# Patient Record
Sex: Male | Born: 1960 | Race: White | Hispanic: No | Marital: Married | Smoking: Never smoker
Health system: Southern US, Community
[De-identification: ages and names within clinical notes are randomized; demographics above are authoritative.]

## PROBLEM LIST (undated history)

## (undated) DIAGNOSIS — I10 Essential (primary) hypertension: Secondary | ICD-10-CM

---

## 2003-01-03 ENCOUNTER — Emergency Department (HOSPITAL_COMMUNITY): Admission: EM | Admit: 2003-01-03 | Discharge: 2003-01-03 | Payer: Self-pay | Admitting: Emergency Medicine

## 2003-01-17 ENCOUNTER — Emergency Department (HOSPITAL_COMMUNITY): Admission: AD | Admit: 2003-01-17 | Discharge: 2003-01-17 | Payer: Self-pay | Admitting: Family Medicine

## 2020-02-06 ENCOUNTER — Other Ambulatory Visit: Payer: Self-pay

## 2020-02-06 ENCOUNTER — Encounter (HOSPITAL_BASED_OUTPATIENT_CLINIC_OR_DEPARTMENT_OTHER): Payer: Self-pay | Admitting: *Deleted

## 2020-02-06 ENCOUNTER — Emergency Department (HOSPITAL_BASED_OUTPATIENT_CLINIC_OR_DEPARTMENT_OTHER)
Admission: EM | Admit: 2020-02-06 | Discharge: 2020-02-06 | Disposition: A | Discharge status: Home / Self Care | Payer: PRIVATE HEALTH INSURANCE | Attending: Emergency Medicine | Admitting: Emergency Medicine

## 2020-02-06 DIAGNOSIS — F1010 Alcohol abuse, uncomplicated: Secondary | ICD-10-CM | POA: Diagnosis not present

## 2020-02-06 DIAGNOSIS — F419 Anxiety disorder, unspecified: Secondary | ICD-10-CM | POA: Insufficient documentation

## 2020-02-06 DIAGNOSIS — I10 Essential (primary) hypertension: Secondary | ICD-10-CM | POA: Insufficient documentation

## 2020-02-06 LAB — CBC WITH DIFFERENTIAL/PLATELET
Abs Immature Granulocytes: 0.02 10*3/uL (ref 0.00–0.07)
Basophils Absolute: 0.1 10*3/uL (ref 0.0–0.1)
Basophils Relative: 1 %
Eosinophils Absolute: 0 10*3/uL (ref 0.0–0.5)
Eosinophils Relative: 1 %
HCT: 44.6 % (ref 39.0–52.0)
Hemoglobin: 15.6 g/dL (ref 13.0–17.0)
Immature Granulocytes: 0 %
Lymphocytes Relative: 18 %
Lymphs Abs: 1.6 10*3/uL (ref 0.7–4.0)
MCH: 35.2 pg — ABNORMAL HIGH (ref 26.0–34.0)
MCHC: 35 g/dL (ref 30.0–36.0)
MCV: 100.7 fL — ABNORMAL HIGH (ref 80.0–100.0)
Monocytes Absolute: 1.3 10*3/uL — ABNORMAL HIGH (ref 0.1–1.0)
Monocytes Relative: 15 %
Neutro Abs: 5.7 10*3/uL (ref 1.7–7.7)
Neutrophils Relative %: 65 %
Platelets: 213 10*3/uL (ref 150–400)
RBC: 4.43 MIL/uL (ref 4.22–5.81)
RDW: 12.1 % (ref 11.5–15.5)
WBC: 8.7 10*3/uL (ref 4.0–10.5)
nRBC: 0 % (ref 0.0–0.2)

## 2020-02-06 LAB — COMPREHENSIVE METABOLIC PANEL
ALT: 55 U/L — ABNORMAL HIGH (ref 0–44)
AST: 73 U/L — ABNORMAL HIGH (ref 15–41)
Albumin: 4.8 g/dL (ref 3.5–5.0)
Alkaline Phosphatase: 54 U/L (ref 38–126)
Anion gap: 18 — ABNORMAL HIGH (ref 5–15)
BUN: 10 mg/dL (ref 6–20)
CO2: 24 mmol/L (ref 22–32)
Calcium: 9.8 mg/dL (ref 8.9–10.3)
Chloride: 95 mmol/L — ABNORMAL LOW (ref 98–111)
Creatinine, Ser: 0.84 mg/dL (ref 0.61–1.24)
GFR, Estimated: >60 mL/min (ref >60)
Glucose, Bld: 99 mg/dL (ref 70–99)
Potassium: 3.6 mmol/L (ref 3.5–5.1)
Sodium: 137 mmol/L (ref 135–145)
Total Bilirubin: 1.1 mg/dL (ref 0.3–1.2)
Total Protein: 8.8 g/dL — ABNORMAL HIGH (ref 6.5–8.1)

## 2020-02-06 LAB — OCCULT BLOOD X 1 CARD TO LAB, STOOL: Fecal Occult Bld: NEGATIVE

## 2020-02-06 MED ORDER — SODIUM CHLORIDE 0.9 % IV BOLUS
1000.0000 mL | Freq: Once | INTRAVENOUS | Status: AC
  Administered 2020-02-06: 1000 mL via INTRAVENOUS

## 2020-02-06 MED ORDER — LORAZEPAM 2 MG/ML IJ SOLN: 2.0000 mg | Freq: Once | INTRAMUSCULAR | Status: DC

## 2020-02-06 MED ORDER — LORAZEPAM 2 MG/ML IJ SOLN
2.0000 mg | Freq: Once | INTRAMUSCULAR | Status: AC
  Administered 2020-02-06: 2 mg via INTRAVENOUS
  Filled 2020-02-06: qty 1

## 2020-02-06 NOTE — ED Triage Notes (Signed)
He has had a very stressful day. He got tremors this afternoon. He does not take anxiety medications. He is a heavy alcohol drinker. He noticed bright red blood in his stool this am.

## 2020-02-06 NOTE — ED Provider Notes (Signed)
[facility] EMERGENCY DEPARTMENT Provider Note   CSN: [id number] Arrival date & time: 02/06/20  1638     History Chief Complaint  Patient presents with  . Hypertension    Matthew Cole is a 59 y.o. male.  The history is provided by the patient.  Illness Location:  Genereal Quality:  Tremors, anxiety Severity:  Moderate Onset quality:  Gradual Timing:  Constant Progression:  Unchanged Chronicity:  New Context:  Stressed, tremors, heavy alcohol user with last use yesterday., Having tremors, but no SI/HI. Not ready for rehab. Relieved by:  Nothing Worsened by:  Nothing Associated symptoms: no abdominal pain, no chest pain, no congestion, no cough, no ear pain, no fever, no rash, no shortness of breath, no sore throat and no vomiting        History reviewed. No pertinent past medical history.  There are no problems to display for this patient.   History reviewed. No pertinent surgical history.     No family history on file.  Social History   Tobacco Use  . Smoking status: Never Smoker  . Smokeless tobacco: Never Used  Substance Use Topics  . Alcohol use: Yes    Comment: 1 bottle of wine and a strong liquior drink every night    . Drug use: Never    Home Medications Prior to Admission medications   Not on File    Allergies    Patient has no known allergies.  Review of Systems   Review of Systems  Constitutional: Negative for chills and fever.  HENT: Negative for congestion, ear pain and sore throat.   Eyes: Negative for pain and visual disturbance.  Respiratory: Negative for cough and shortness of breath.   Cardiovascular: Negative for chest pain and palpitations.  Gastrointestinal: Positive for blood in stool. Negative for abdominal pain and vomiting.  Genitourinary: Negative for dysuria and hematuria.  Musculoskeletal: Negative for arthralgias and back pain.  Skin: Negative for color change and rash.  Neurological: Positive for  tremors. Negative for seizures and syncope.  All other systems reviewed and are negative.   Physical Exam Updated Vital Signs BP (!) 162/97 (BP Location: Right Arm)   Pulse 82   Temp 98 F (36.7 C) (Oral)   Resp (!) 23   Ht 6\' 5"  (1.956 m)   Wt 104.3 kg   SpO2 94%   BMI 27.27 kg/m   Physical Exam Vitals and nursing note reviewed.  Constitutional:      Appearance: He is well-developed.  HENT:     Head: Normocephalic and atraumatic.     Mouth/Throat:     Mouth: Mucous membranes are moist.  Eyes:     Extraocular Movements: Extraocular movements intact.     Conjunctiva/sclera: Conjunctivae normal.     Pupils: Pupils are equal, round, and reactive to light.  Cardiovascular:     Rate and Rhythm: Normal rate and regular rhythm.     Pulses: Normal pulses.     Heart sounds: No murmur heard.   Pulmonary:     Effort: Pulmonary effort is normal. No respiratory distress.     Breath sounds: Normal breath sounds.  Abdominal:     Palpations: Abdomen is soft.     Tenderness: There is no abdominal tenderness.  Musculoskeletal:     Cervical back: Neck supple.  Skin:    General: Skin is warm and dry.     Capillary Refill: Capillary refill takes less than 2 seconds.  Neurological:     General:  No focal deficit present.     Mental Status: He is alert and oriented to person, place, and time.     Cranial Nerves: No cranial nerve deficit.     Sensory: No sensory deficit.     Motor: No weakness.     Coordination: Coordination normal.     Comments: Resting tremor   Psychiatric:        Mood and Affect: Mood is anxious.        Thought Content: Thought content does not include homicidal or suicidal ideation.     ED Results / Procedures / Treatments   Labs (all labs ordered are listed, but only abnormal results are displayed) Labs Reviewed  CBC WITH DIFFERENTIAL/PLATELET - Abnormal; Notable for the following components:      Result Value   MCV 100.7 (*)    MCH 35.2 (*)     Monocytes Absolute 1.3 (*)    All other components within normal limits  COMPREHENSIVE METABOLIC PANEL - Abnormal; Notable for the following components:   Chloride 95 (*)    Total Protein 8.8 (*)    AST 73 (*)    ALT 55 (*)    Anion gap 18 (*)    All other components within normal limits  OCCULT BLOOD X 1 CARD TO LAB, STOOL    EKG EKG Interpretation  Date/Time:  Tuesday February 06 2020 17:00:30 EST Ventricular Rate:  98 PR Interval:  152 QRS Duration: 106 QT Interval:  376 QTC Calculation: 480 R Axis:   18 Text Interpretation: Sinus rhythm with frequent Premature ventricular complexes Minimal voltage criteria for LVH, may be normal variant ( Cornell product ) Prolonged QT Abnormal ECG Confirmed by Virgina Norfolk [phone]) on 02/06/2020 5:33:48 PM   Radiology No results found.  Procedures Procedures (including critical care time)  Medications Ordered in ED Medications  LORazepam (ATIVAN) injection 2 mg (2 mg Intravenous Given 02/06/20 1801)  sodium chloride 0.9 % bolus 1,000 mL (0 mLs Intravenous Stopped 02/06/20 1908)    ED Course  I have reviewed the triage vital signs and the nursing notes.  Pertinent labs & imaging results that were available during my care of the patient were reviewed by me and considered in my medical decision making (see chart for details).    MDM Rules/Calculators/A&P                          Matthew Cole is a 59 year old male with history of alcohol abuse who presents the ED with tremors, anxiety.  Normal vitals.  No fever.  Has had bad tremors for the last several hours with extreme anxiety.  Has not had alcohol today.  Last alcoholic beverage was last night.  Not ready for rehab.  Denies any suicidal homicidal ideation.  Possibly some mild alcohol withdrawal symptoms versus anxiety.  Patient improved following IV fluids and Ativan.  On reevaluation no longer having tremors.  Further discussion about rehab and patient is not ready for it and  will not prescribe Librium at this time.  He has resources to rehab already and was given him additional resources.  Lab work overall is unremarkable.  Mild liver enzyme elevation likely in the setting of chronic alcohol abuse.  Not having any abdominal pain.  Patient has had some blood in his stool over the last several weeks but Hemoccult was negative.  Grossly brown stool on exam.  Hemoglobin normal.  Likely hemorrhoid.  Given return precautions and  discharged from ED in good condition.  This chart was dictated using voice recognition software.  Despite best efforts to proofread,  errors can occur which can change the documentation meaning.   Final Clinical Impression(s) / ED Diagnoses Final diagnoses:  Anxiety  ETOH abuse    Rx / DC Orders ED Discharge Orders    None       Virgina Norfolk, DO 02/06/20 1910

## 2020-02-06 NOTE — ED Notes (Signed)
Discharge instructions discussed with patient. Verbalized understanding. Departs ED in stable condition at this time.   

## 2020-07-26 ENCOUNTER — Emergency Department (HOSPITAL_COMMUNITY): Payer: PRIVATE HEALTH INSURANCE

## 2020-07-26 ENCOUNTER — Emergency Department (HOSPITAL_COMMUNITY)
Admission: EM | Admit: 2020-07-26 | Discharge: 2020-07-26 | Disposition: A | Discharge status: Home / Self Care | Payer: PRIVATE HEALTH INSURANCE | Attending: Emergency Medicine | Admitting: Emergency Medicine

## 2020-07-26 DIAGNOSIS — F1090 Alcohol use, unspecified, uncomplicated: Secondary | ICD-10-CM

## 2020-07-26 DIAGNOSIS — Z7289 Other problems related to lifestyle: Secondary | ICD-10-CM

## 2020-07-26 DIAGNOSIS — R030 Elevated blood-pressure reading, without diagnosis of hypertension: Secondary | ICD-10-CM | POA: Insufficient documentation

## 2020-07-26 DIAGNOSIS — F1010 Alcohol abuse, uncomplicated: Secondary | ICD-10-CM | POA: Insufficient documentation

## 2020-07-26 DIAGNOSIS — Z789 Other specified health status: Secondary | ICD-10-CM

## 2020-07-26 DIAGNOSIS — Z79899 Other long term (current) drug therapy: Secondary | ICD-10-CM | POA: Insufficient documentation

## 2020-07-26 DIAGNOSIS — Y906 Blood alcohol level of 120-199 mg/100 ml: Secondary | ICD-10-CM | POA: Diagnosis not present

## 2020-07-26 DIAGNOSIS — R1013 Epigastric pain: Secondary | ICD-10-CM

## 2020-07-26 DIAGNOSIS — K625 Hemorrhage of anus and rectum: Secondary | ICD-10-CM

## 2020-07-26 LAB — COMPREHENSIVE METABOLIC PANEL
ALT: 72 U/L — ABNORMAL HIGH (ref 0–44)
AST: 93 U/L — ABNORMAL HIGH (ref 15–41)
Albumin: 4.3 g/dL (ref 3.5–5.0)
Alkaline Phosphatase: 56 U/L (ref 38–126)
Anion gap: 16 — ABNORMAL HIGH (ref 5–15)
BUN: 12 mg/dL (ref 8–23)
CO2: 22 mmol/L (ref 22–32)
Calcium: 8.9 mg/dL (ref 8.9–10.3)
Chloride: 103 mmol/L (ref 98–111)
Creatinine, Ser: 0.79 mg/dL (ref 0.61–1.24)
GFR, Estimated: >60 mL/min (ref >60)
Glucose, Bld: 104 mg/dL — ABNORMAL HIGH (ref 70–99)
Potassium: 4.2 mmol/L (ref 3.5–5.1)
Sodium: 141 mmol/L (ref 135–145)
Total Bilirubin: 0.7 mg/dL (ref 0.3–1.2)
Total Protein: 7.8 g/dL (ref 6.5–8.1)

## 2020-07-26 LAB — CBC WITH DIFFERENTIAL/PLATELET
Abs Immature Granulocytes: 0.01 10*3/uL (ref 0.00–0.07)
Basophils Absolute: 0 10*3/uL (ref 0.0–0.1)
Basophils Relative: 0 %
Eosinophils Absolute: 0 10*3/uL (ref 0.0–0.5)
Eosinophils Relative: 0 %
HCT: 38.9 % — ABNORMAL LOW (ref 39.0–52.0)
Hemoglobin: 13 g/dL (ref 13.0–17.0)
Immature Granulocytes: 0 %
Lymphocytes Relative: 20 %
Lymphs Abs: 0.9 10*3/uL (ref 0.7–4.0)
MCH: 34.9 pg — ABNORMAL HIGH (ref 26.0–34.0)
MCHC: 33.4 g/dL (ref 30.0–36.0)
MCV: 104.6 fL — ABNORMAL HIGH (ref 80.0–100.0)
Monocytes Absolute: 0.4 10*3/uL (ref 0.1–1.0)
Monocytes Relative: 9 %
Neutro Abs: 3.1 10*3/uL (ref 1.7–7.7)
Neutrophils Relative %: 71 %
Platelets: 124 10*3/uL — ABNORMAL LOW (ref 150–400)
RBC: 3.72 MIL/uL — ABNORMAL LOW (ref 4.22–5.81)
RDW: 12 % (ref 11.5–15.5)
WBC: 4.5 10*3/uL (ref 4.0–10.5)
nRBC: 0 % (ref 0.0–0.2)

## 2020-07-26 LAB — POC OCCULT BLOOD, ED: Fecal Occult Bld: POSITIVE — AB

## 2020-07-26 LAB — ETHANOL: Alcohol, Ethyl (B): 182 mg/dL — ABNORMAL HIGH (ref <10)

## 2020-07-26 LAB — LIPASE, BLOOD: Lipase: 40 U/L (ref 11–51)

## 2020-07-26 MED ORDER — THIAMINE HCL 100 MG/ML IJ SOLN
100.0000 mg | Freq: Once | INTRAMUSCULAR | Status: AC
  Administered 2020-07-26: 100 mg via INTRAVENOUS
  Filled 2020-07-26: qty 2

## 2020-07-26 MED ORDER — SODIUM CHLORIDE 0.9 % IV BOLUS
1000.0000 mL | Freq: Once | INTRAVENOUS | Status: AC
  Administered 2020-07-26: 1000 mL via INTRAVENOUS

## 2020-07-26 MED ORDER — CHLORDIAZEPOXIDE HCL 25 MG PO CAPS
25.0000 mg | ORAL_CAPSULE | Freq: Once | ORAL | Status: AC
  Administered 2020-07-26: 25 mg via ORAL
  Filled 2020-07-26: qty 1

## 2020-07-26 MED ORDER — SUCRALFATE 1 G PO TABS: 1.0000 g | ORAL_TABLET | Freq: Three times a day (TID) | ORAL | 0 refills | Status: DC

## 2020-07-26 MED ORDER — LORAZEPAM 2 MG/ML IJ SOLN
2.0000 mg | Freq: Once | INTRAMUSCULAR | Status: AC
  Administered 2020-07-26: 2 mg via INTRAVENOUS
  Filled 2020-07-26: qty 1

## 2020-07-26 MED ORDER — CHLORDIAZEPOXIDE HCL 25 MG PO CAPS: ORAL_CAPSULE | ORAL | 0 refills | Status: DC

## 2020-07-26 MED ORDER — ONDANSETRON HCL 4 MG/2ML IJ SOLN
4.0000 mg | Freq: Once | INTRAMUSCULAR | Status: AC
  Administered 2020-07-26: 4 mg via INTRAVENOUS
  Filled 2020-07-26: qty 2

## 2020-07-26 MED ORDER — PANTOPRAZOLE SODIUM 20 MG PO TBEC: 20.0000 mg | DELAYED_RELEASE_TABLET | Freq: Every day | ORAL | 0 refills | Status: DC

## 2020-07-26 MED ORDER — PANTOPRAZOLE SODIUM 40 MG IV SOLR
40.0000 mg | Freq: Once | INTRAVENOUS | Status: AC
  Administered 2020-07-26: 40 mg via INTRAVENOUS
  Filled 2020-07-26: qty 40

## 2020-07-26 NOTE — ED Notes (Signed)
Notified Lindsey PA of pts CIWA score.

## 2020-07-26 NOTE — ED Triage Notes (Signed)
Pt admits to drinking 1 pint of liquor/day last drink 8am today tremors noted
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
## 2020-07-26 NOTE — Discharge Instructions (Addendum)
As we discussed, your rectal exam did show some mild blood.  Your hemoglobin (your blood levels) were stable.  This may be caused by some alcoholic gastritis that is causing some small bleeding.  You will need to follow-up with GI.  I provided a referral.  In the meantime, take Carafate, Protonix to help with symptoms.  Additionally, I prescribed you Librium that you can take to help you prevent withdrawal from alcohol.  Follow-up with your primary care doctor.  Return the emergency department if you start having more black or tarry stools, bright red blood from the rectum, worsening abdominal pain, vomiting that is unable to be controlled or any other worsening concerning symptoms.
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
## 2021-11-30 IMAGING — CT CT ABD-PELV W/O CM
2 of 4 series · 16 of 46 positions shown, 18 images · non-contrast
Comparison: None.

CLINICAL DATA: Acute epigastric abdominal pain.

EXAM:
CT ABDOMEN AND PELVIS WITHOUT CONTRAST
TECHNIQUE: Multidetector CT imaging of the abdomen and pelvis was performed
following the standard protocol without IV contrast.

[Series 2: axial st · axial · 0.82mm/px · z∈[+1078,+1568]mm · 13 of 112 slices shown, 15 images]
[im 7/112  soft-tissue]
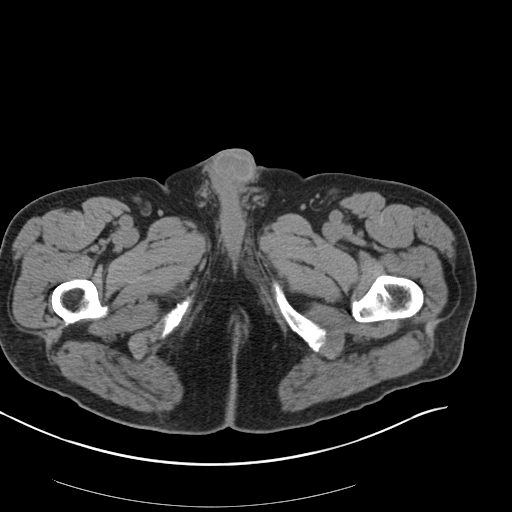
[im 7/112  bone]
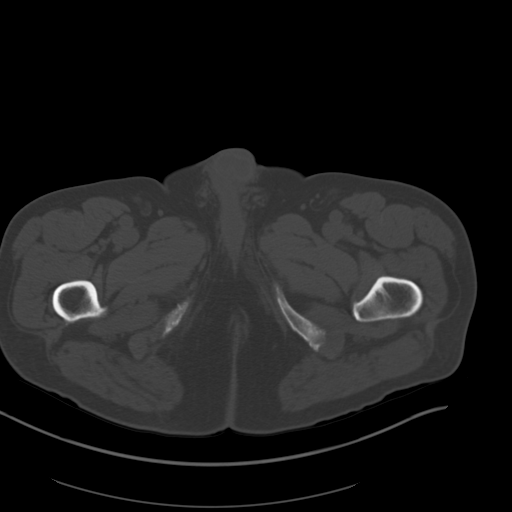
[im 13/112  soft-tissue]
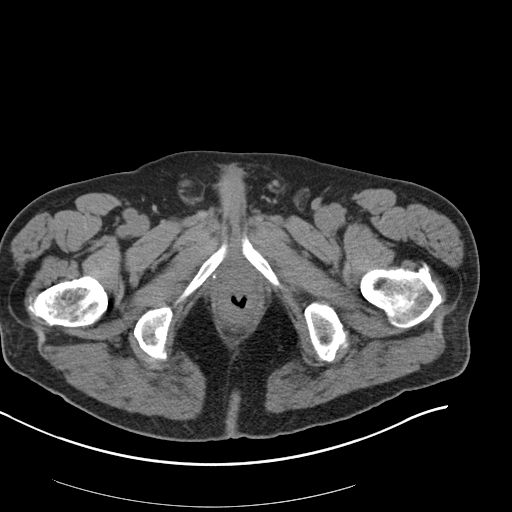
[im 25/112  soft-tissue]
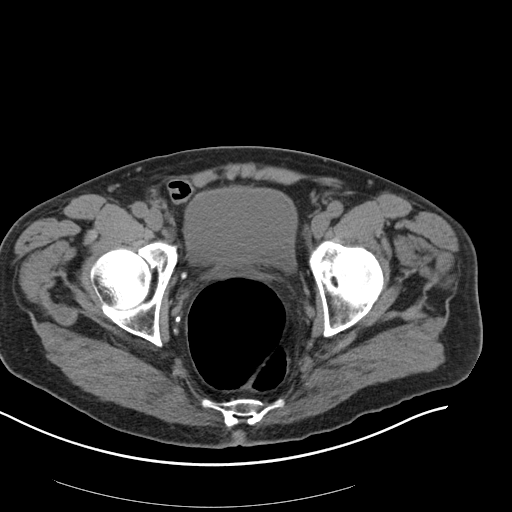
[im 31/112  soft-tissue]
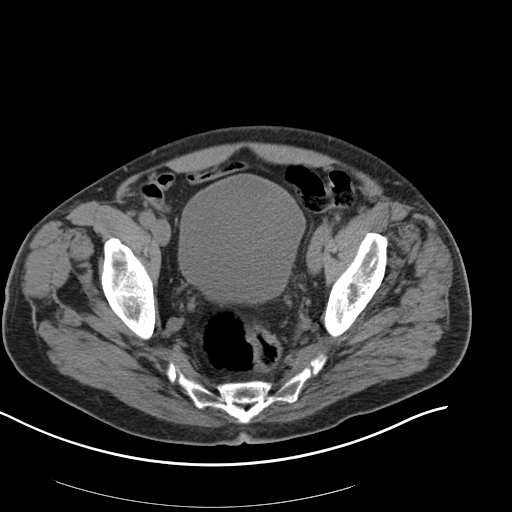
[im 38/112  soft-tissue]
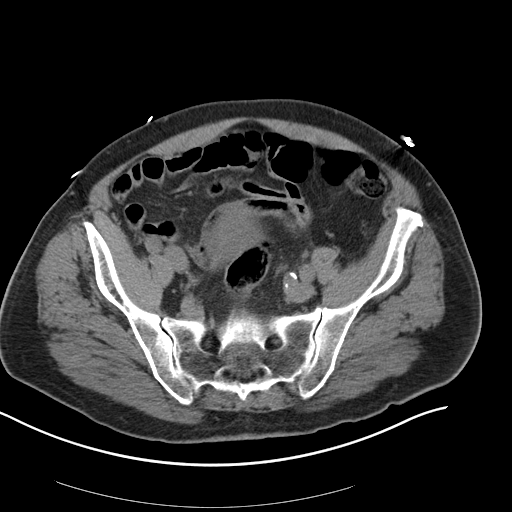
[im 50/112  soft-tissue]
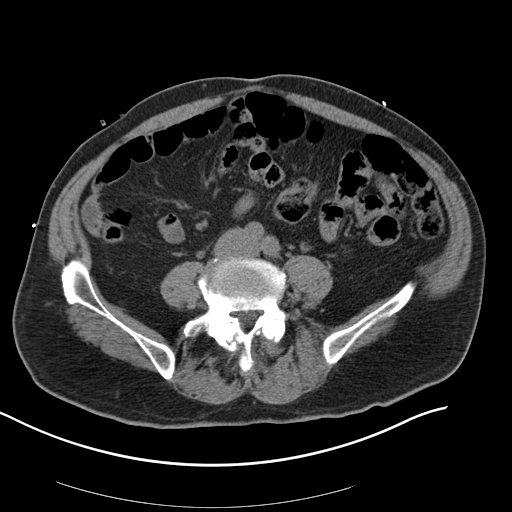
[im 56/112  soft-tissue]
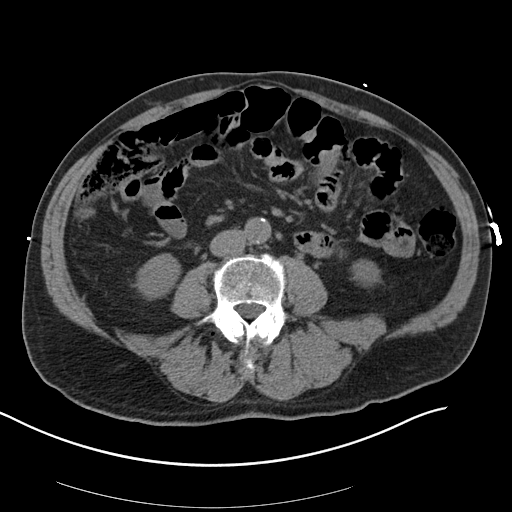
[im 62/112  soft-tissue]
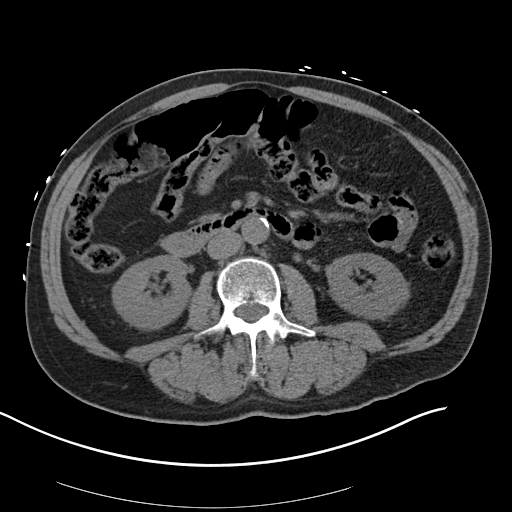
[im 75/112  soft-tissue]
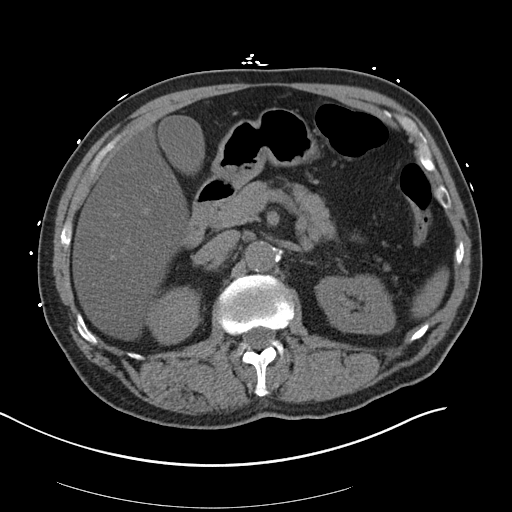
[im 75/112  bone]
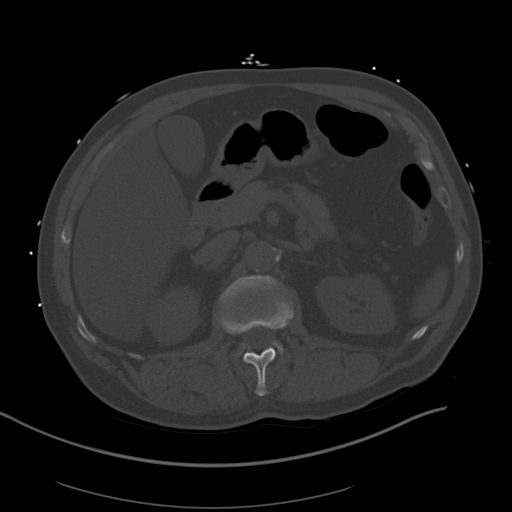
[im 81/112  soft-tissue]
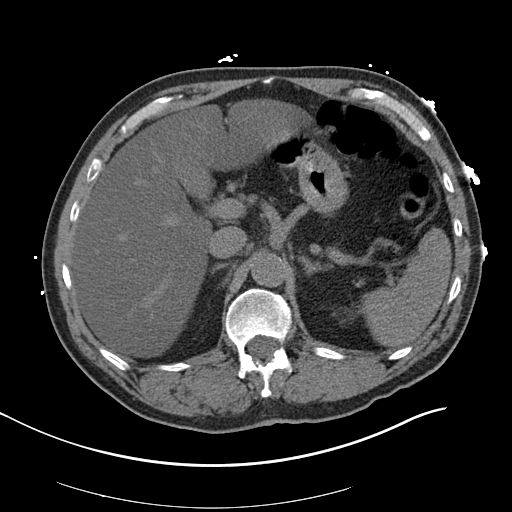
[im 87/112  soft-tissue]
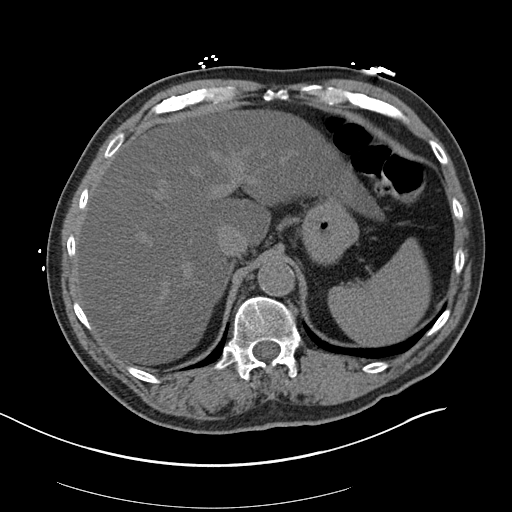
[im 99/112  soft-tissue]
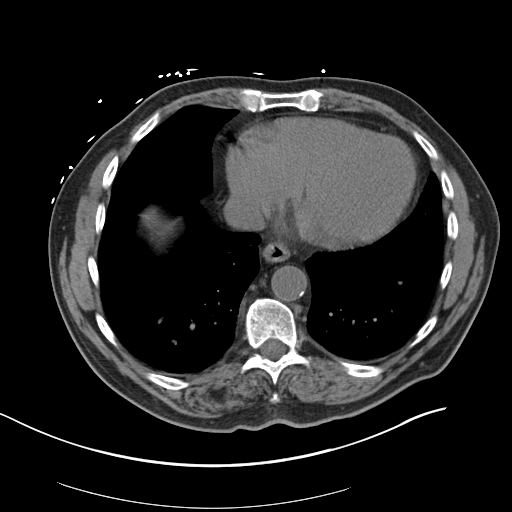
[im 105/112  soft-tissue]
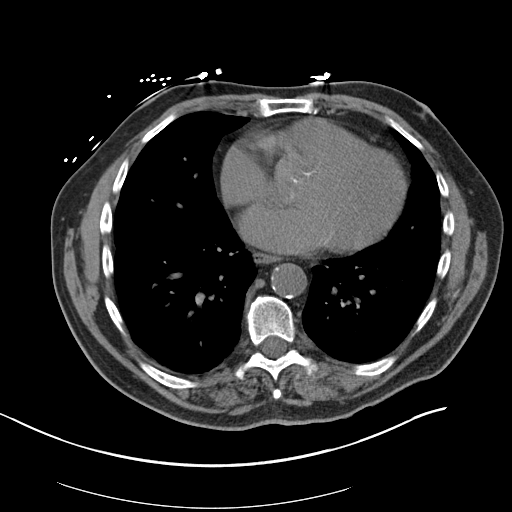

[Series 5: coronal st · coronal · 0.78mm/px · 3 of 163 slices shown]
[im 55/163  soft-tissue]
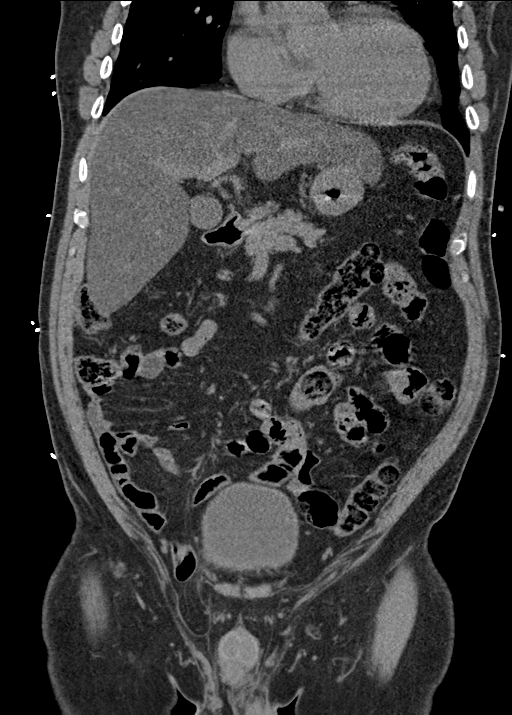
[im 73/163  soft-tissue]
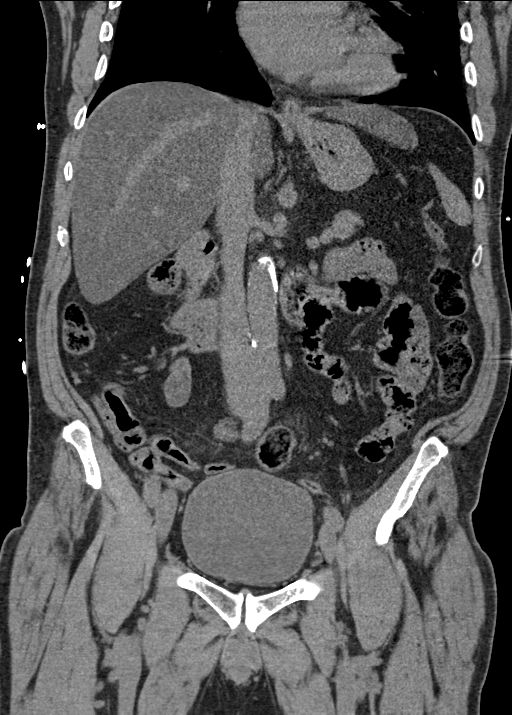
[im 91/163  soft-tissue]
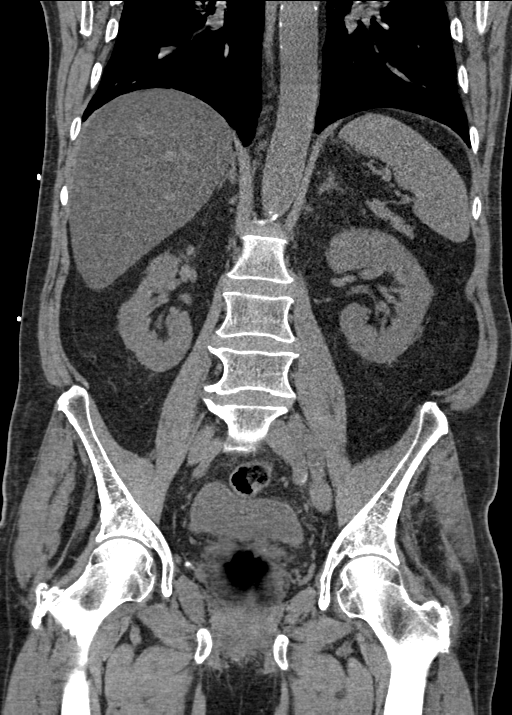

[16 of 46 positions shown; findings below may reference images not displayed]

FINDINGS: Lower chest: No acute abnormality.

Hepatobiliary: Hepatic steatosis. No gallstones or biliary
dilatation.

Pancreas: Unremarkable. No pancreatic ductal dilatation or
surrounding inflammatory changes.

Spleen: Normal in size without focal abnormality.

Adrenals/Urinary Tract: Adrenal glands are unremarkable. Kidneys are
normal, without renal calculi, focal lesion, or hydronephrosis.
Bladder is unremarkable.

Stomach/Bowel: Stomach is within normal limits. Appendix appears
normal. No evidence of bowel wall thickening, distention, or
inflammatory changes.

Vascular/Lymphatic: Aortic atherosclerosis. No enlarged abdominal or
pelvic lymph nodes.

Reproductive: Prostate is unremarkable.

Other: Small fat containing right inguinal hernia is noted. No
ascites is noted.

Musculoskeletal: No acute or significant osseous findings.
IMPRESSION: Hepatic steatosis.

Small fat containing right inguinal hernia.

No other abnormality seen in the abdomen or pelvis.

Aortic Atherosclerosis ([id number]).

## 2022-10-29 ENCOUNTER — Other Ambulatory Visit: Payer: Self-pay

## 2022-10-29 DIAGNOSIS — I8390 Asymptomatic varicose veins of unspecified lower extremity: Secondary | ICD-10-CM

## 2022-11-06 ENCOUNTER — Ambulatory Visit (HOSPITAL_COMMUNITY)
Admission: RE | Admit: 2022-11-06 | Discharge: 2022-11-06 | Disposition: A | Discharge status: Home / Self Care | Payer: 59 | Source: Ambulatory Visit | Attending: Vascular Surgery | Admitting: Vascular Surgery

## 2022-11-06 ENCOUNTER — Encounter: Payer: Self-pay | Admitting: Physician Assistant

## 2022-11-06 ENCOUNTER — Ambulatory Visit (INDEPENDENT_AMBULATORY_CARE_PROVIDER_SITE_OTHER): Payer: 59 | Admitting: Physician Assistant

## 2022-11-06 ENCOUNTER — Ambulatory Visit: Payer: 59 | Admitting: Cardiology

## 2022-11-06 ENCOUNTER — Encounter: Payer: Self-pay | Admitting: Cardiology

## 2022-11-06 VITALS — BP 121/69 | HR 62 | Temp 97.9°F | Resp 16 | Ht 76.0 in | Wt 236.7 lb

## 2022-11-06 VITALS — BP 156/72 | HR 70 | Resp 16 | Ht 76.0 in | Wt 237.0 lb

## 2022-11-06 DIAGNOSIS — I10 Essential (primary) hypertension: Secondary | ICD-10-CM

## 2022-11-06 DIAGNOSIS — I77810 Thoracic aortic ectasia: Secondary | ICD-10-CM | POA: Insufficient documentation

## 2022-11-06 DIAGNOSIS — I8390 Asymptomatic varicose veins of unspecified lower extremity: Secondary | ICD-10-CM | POA: Diagnosis not present

## 2022-11-06 DIAGNOSIS — E782 Mixed hyperlipidemia: Secondary | ICD-10-CM

## 2022-11-06 DIAGNOSIS — I872 Venous insufficiency (chronic) (peripheral): Secondary | ICD-10-CM

## 2022-11-06 DIAGNOSIS — I8392 Asymptomatic varicose veins of left lower extremity: Secondary | ICD-10-CM

## 2022-11-06 NOTE — Progress Notes (Signed)
Patient referred by Tally Joe, MD for ascending aorta dilatation  Subjective:   Matthew Cole, male    DOB: 05/18/1960, 62 y.o.   MRN: [id number]   Chief Complaint  Patient presents with   ascending aorta dilatation   New Patient (Initial Visit)     HPI  62 y.o. 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 He denies chest pain, shortness of breath, palpitations, leg edema, orthopnea, PND, TIA/syncope. He recently underwent echocardiogram due to murmur that reportedly showed ascending aorta dilatation, details and echocardiogram report not available to me. His father "almost died" due to asortic aneurysm rupture. Patient has 3 older sisters and one son, not screened for aortic disease.    History reviewed. No pertinent past medical history.   History reviewed. No pertinent surgical history.   Social History   Tobacco Use  Smoking Status Never  Smokeless Tobacco Never    Social History   Substance and Sexual Activity  Alcohol Use Yes   Comment: 1 bottle of wine and a strong liquior drink every night       History reviewed. No pertinent family history.    Current Outpatient Medications:    amLODipine (NORVASC) 10 MG tablet, Take 10 mg by mouth daily., Disp: , Rfl:    HYDROcodone-acetaminophen (NORCO/VICODIN) 5-325 MG tablet, Take 1 tablet by mouth every 6 (six) hours as needed for moderate pain or severe pain., Disp: , Rfl:    chlordiazePOXIDE (LIBRIUM) 25 MG capsule, 50mg  PO TID x 1D, then 25-50mg  PO BID X 1D, then 25-50mg  PO QD X 1D, Disp: 10 capsule, Rfl: 0   olmesartan (BENICAR) 40 MG tablet, Take 40 mg by mouth daily., Disp: , Rfl:    pantoprazole (PROTONIX) 20 MG tablet, Take 1 tablet (20 mg total) by mouth daily for 14 days., Disp: 14 tablet, Rfl: 0   rosuvastatin (CRESTOR) 5 MG  tablet, Take 5 mg by mouth daily., Disp: , Rfl:    silver sulfADIAZINE (SILVADENE) 1 % cream, Apply topically 2 (two) times daily., Disp: , Rfl:    sucralfate (CARAFATE) 1 g tablet, Take 1 tablet (1 g total) by mouth 4 (four) times daily -  with meals and at bedtime for 14 days., Disp: 56 tablet, Rfl: 0   traZODone (DESYREL) 150 MG tablet, Take 150 mg by mouth at bedtime., Disp: , Rfl:    Cardiovascular and other pertinent studies:  Reviewed external labs and tests, independently interpreted  EKG 11/06/2022: Sinus rhythm 65 bpm Normal EKG  Echocardiogram 10/08/2022: EF 66%. Trace MR, trace TR, mild AS, mild mitral and aortic valve calcification,  Moderately dilated ascending aorta  CT abdomen pelvis 07/2020: Hepatic steatosis.  Small fat containing right inguinal hernia. No other abnormality seen in the abdomen or pelvis. Aortic Atherosclerosis (ICD10-I70.0).    Recent labs: 09/14/2022: Glucose 84, BUN/Cr 16/0.89. EGFR 96. Na/K 138/4.5. Rest of the CMP normal H/H 13/40. MCV 102. Platelets 169 HbA1C NA Chol 247, TG 186, HDL 61, LDL 174 TSH 1.8 normal  03/2022: Chol 251, TG 153, HDL 69, LDL 154   Review of Systems  Cardiovascular:  Negative for chest pain, dyspnea on exertion, leg swelling, palpitations and syncope.         Vitals:   11/06/22 1105  BP: (!) 156/72  Pulse: 70  Resp: 16  SpO2: 95%     Body mass index is  28.85 kg/m. Filed Weights   11/06/22 1105  Weight: 237 lb (107.5 kg)     Objective:   Physical Exam Vitals and nursing note reviewed.  Constitutional:      General: He is not in acute distress. Neck:     Vascular: No JVD.  Cardiovascular:     Rate and Rhythm: Normal rate and regular rhythm.     Heart sounds: Normal heart sounds. No murmur heard. Pulmonary:     Effort: Pulmonary effort is normal.     Breath sounds: Normal breath sounds. No wheezing or rales.  Musculoskeletal:     Right lower leg: No edema.     Left lower leg: No edema.             Visit diagnoses:   ICD-10-CM   1. 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    Assessment & Recommendations:   62 y/o Caucasian male with hypertension, hyperlipidemia, venous insufficiency, family h/o aortic aneurysm w/rupture, referred with ascending aorta dilatation  Ascending aorta dilatation: Incidental finding, details not available to me. Concern for familial aortopathy with family h/o aortic aneurysm rupture in his father.  Will obtain CTA aorta. Avoid weight lifting, Valsalva maneuvers, antibiotics like ciprofloxacin. After CTA aorta, he will likely need CVTS evaluation and genetic testing.  Continue ARB> Blood pressure usually well controlled.  Mixed hyperlipidemia: Currently on Crestor 5 mg daily. Check repeat lipid panel.  Further recommendations after above testing.  Thank you for referring the patient to [place]. Please feel free to contact with any questions.   Elder Negus, MD Pager: [phone] Office: [phone]

## 2022-11-06 NOTE — Progress Notes (Signed)
[facility] [place]   Reason for referral: Swollen left  leg  History of Present Illness  Matthew Cole is a 62 y.o. male who presents with chief complaint: swollen leg.  Patient notes, onset of swelling with an ankle wound that has been present for 3 months ago, associated with varicose veins and chronic venous insufficieny.  The patient has had no history of DVT, positive history of varicose vein, positive history of venous stasis ulcers, no history of  Lymphedema and positive history of skin changes in lower legs.  There is unknown family history of venous disorders.  The patient has not used compression stockings in the past.  His primary care has been treating his wound with topicals.  The wound has not improved over the last 3 months so he was referred to [place] for exam and a treatment plan.      HTN, Hyperlipidemia, GERD.  He is medically managed on Crestor and anti hypertensive's.    Social History   Socioeconomic History   Marital status: Married    Spouse name: Not on file   Number of children: Not on file   Years of education: Not on file   Highest education level: Not on file  Occupational History   Not on file  Tobacco Use   Smoking status: Never   Smokeless tobacco: Never  Substance and Sexual Activity   Alcohol use: Yes    Comment: 1 bottle of wine and a strong liquior drink every night     Drug use: Never   Sexual activity: Not on file  Other Topics Concern   Not on file  Social History Narrative   Not on file   Social Determinants of Health   Financial Resource Strain: Not on file  Food Insecurity: No Food Insecurity (09/12/2020)   Received from [facility], [facility]   Hunger Vital Sign    Worried About Running Out of Food in the Last Year: Never true    Ran Out of Food in the Last Year: Never true  Transportation Needs: Not on file  Physical Activity: Not on file  Stress: Not on file  Social Connections: Unknown (07/22/2021)    Received from [facility], [facility]   Social Network    Social Network: Not on file  Intimate Partner Violence: Unknown (06/13/2021)   Received from [facility], [facility]   HITS    Physically Hurt: Not on file    Insult or Talk Down To: Not on file    Threaten Physical Harm: Not on file    Scream or Curse: Not on file    No family history on file.  Current Outpatient Medications on File Prior to Visit  Medication Sig Dispense Refill   amLODipine (NORVASC) 10 MG tablet Take 10 mg by mouth daily.     rosuvastatin (CRESTOR) 5 MG tablet Take 5 mg by mouth daily.     traZODone (DESYREL) 150 MG tablet Take 150 mg by mouth at bedtime.     cephALEXin (KEFLEX) 500 MG capsule Take 500 mg by mouth 2 (two) times daily. (Patient not taking: Reported on 11/06/2022)     chlordiazePOXIDE (LIBRIUM) 25 MG capsule 50mg  PO TID x 1D, then 25-50mg  PO BID X 1D, then 25-50mg  PO QD X 1D 10 capsule 0   HYDROcodone-acetaminophen (NORCO/VICODIN) 5-325 MG tablet Take 1 tablet by mouth every 6 (six) hours as needed for moderate pain or severe pain.     mupirocin ointment (  BACTROBAN) 2 % Apply 1 application topically 2 (two) times daily.     olmesartan (BENICAR) 40 MG tablet Take 40 mg by mouth daily.     pantoprazole (PROTONIX) 20 MG tablet Take 1 tablet (20 mg total) by mouth daily for 14 days. 14 tablet 0   silver sulfADIAZINE (SILVADENE) 1 % cream Apply topically 2 (two) times daily.     sucralfate (CARAFATE) 1 g tablet Take 1 tablet (1 g total) by mouth 4 (four) times daily -  with meals and at bedtime for 14 days. 56 tablet 0   No current facility-administered medications on file prior to visit.    Allergies as of 11/06/2022   (No Known Allergies)     ROS:   General:  No weight loss, Fever, chills  HEENT: No recent headaches, no nasal bleeding, no visual changes, no sore throat  Neurologic: No dizziness, blackouts, seizures. No recent symptoms of stroke or mini- stroke. No recent episodes  of slurred speech, or temporary blindness.  Cardiac: No recent episodes of chest pain/pressure, no shortness of breath at rest.  No shortness of breath with exertion.  Denies history of atrial fibrillation or irregular heartbeat  Vascular: No history of rest pain in feet.  No history of claudication.  positive history of non-healing ulcer, No history of DVT   Pulmonary: No home oxygen, no productive cough, no hemoptysis,  No asthma or wheezing  Musculoskeletal:  [ ]  Arthritis, [ ]  Low back pain,  [ ]  Joint pain  Hematologic:No history of hypercoagulable state.  No history of easy bleeding.  No history of anemia  Gastrointestinal: No hematochezia or melena,  No gastroesophageal reflux, no trouble swallowing  Urinary: [ ]  chronic Kidney disease, [ ]  on HD - [ ]  MWF or [ ]  TTHS, [ ]  Burning with urination, [ ]  Frequent urination, [ ]  Difficulty urinating;   Skin: No rashes  Psychological: No history of anxiety,  No history of depression  Physical Examination  Vitals:   11/06/22 1007  BP: 121/69  Pulse: 62  Resp: 16  Temp: 97.9 F (36.6 C)  TempSrc: Temporal  SpO2: 94%  Weight: 236 lb 11.2 oz (107.4 kg)  Height: 6\' 4"  (1.93 m)    Body mass index is 28.81 kg/m.  General:  Alert and oriented, no acute distress HEENT: Normal Neck: No bruit or JVD Pulmonary: Clear to auscultation bilaterally Cardiac: Regular Rate and Rhythm with murmur Abdomen: Soft, non-tender, non-distended, no mass, no scars Skin: No rash, brawny skin changes with chronic left medial malleolus venous wound Obvious varicose veins  Extremity Pulses:   radial,  femoral, dorsalis pedis,  pulses bilaterally Musculoskeletal: positive edema  Neurologic: Upper and lower extremity motor 5/5 and symmetric  DATA: +--------------+---------+------+-----------+------------+-----------------  ----+  LEFT         Reflux NoRefluxReflux TimeDiameter cmsComments                                        Yes                                                  +--------------+---------+------+-----------+------------+-----------------  ----+  CFV  yes   >1 second                                     +--------------+---------+------+-----------+------------+-----------------  ----+  FV mid        no                                                            +--------------+---------+------+-----------+------------+-----------------  ----+  Popliteal    no                                    4.95 x 2.18  fluid                                                          collection              +--------------+---------+------+-----------+------------+-----------------  ----+  GSV at [facility]              yes    >500 ms      0.85                            +--------------+---------+------+-----------+------------+-----------------  ----+  GSV prox thigh          yes    >500 ms      0.47    out of fascia           +--------------+---------+------+-----------+------------+-----------------  ----+  GSV mid thigh no                            0.38    out of fascia           +--------------+---------+------+-----------+------------+-----------------  ----+  GSV dist thighno                            0.31    out of fascia           +--------------+---------+------+-----------+------------+-----------------  ----+  GSV at knee             yes    >500 ms      0.39                            +--------------+---------+------+-----------+------------+-----------------  ----+  GSV prox calf           yes    >500 ms      0.55                            +--------------+---------+------+-----------+------------+-----------------  ----+  GSV mid calf            yes    >500 ms      0.46                            +--------------+---------+------+-----------+------------+-----------------  ----+  SSV Pop Fossa            yes    >500 ms      0.88                            +--------------+---------+------+-----------+------------+-----------------  ----+  SSV prox calf           yes    >500 ms      0.88                            +--------------+---------+------+-----------+------------+-----------------  ----+  SSV mid calf            yes    >500 ms      0.63                            +--------------+---------+------+-----------+------------+-----------------  ----+  AASV O        no                            0.42                            +--------------+---------+------+-----------+------------+-----------------  ----+  AASV P        no                            0.33                            +--------------+---------+------+-----------+------------+-----------------  ----+         Summary:  Left:  - No evidence of deep vein thrombosis seen in the left lower extremity,  from the common femoral through the popliteal veins.  - Venous reflux is noted in the left common femoral vein.  - Venous reflux is noted in the left sapheno-femoral junction.  - Venous reflux is noted in the left greater saphenous vein in the thigh.  - Venous reflux is noted in the left greater saphenous vein in the calf.  - Venous reflux is noted in the left short saphenous vein.  - No venous reflux is noted in the AASV.  - 4.95 x 2.18 fluid collection medial knee.      Assessment/Plan: Chronic venous reflux with non healing venous wound for 3+ months and BK varicose veins.   The reflux study is positive - Venous reflux is noted in the left common femoral vein.  - Venous reflux is noted in the left sapheno-femoral junction.  - Venous reflux is noted in the left greater saphenous vein in the thigh.  - Venous reflux is noted in the left greater saphenous vein in the calf.  - Venous reflux is noted in the left short saphenous vein.  Most vein sizes in the GSV are > 0.4 cm. He  will be placed in an UNA boot today for compression to assist with healing.  We will request HH and wound care center for una boot changes.  If these fail he will have to f/u with [place] weekly.  He will fitted with thigh high compression 20-30 mm hg to be worn daily once the wound has healed.  He would benefit from laser ablation and/or vein stab phlebectomy.  He has failed 3 months of conservative wound care by his PCP.    He will f/u with one of our vein specialist for a treatment plan in the next 4 weeks.      Mosetta Pigeon PA-C [facility] [place] Office: [phone]  Matthew Cole in Clinic

## 2022-11-10 ENCOUNTER — Other Ambulatory Visit: Payer: Self-pay

## 2022-11-10 DIAGNOSIS — I872 Venous insufficiency (chronic) (peripheral): Secondary | ICD-10-CM

## 2022-12-04 ENCOUNTER — Ambulatory Visit (INDEPENDENT_AMBULATORY_CARE_PROVIDER_SITE_OTHER): Payer: 59 | Admitting: Physician Assistant

## 2022-12-04 VITALS — BP 127/70 | HR 70 | Temp 98.1°F | Wt 234.0 lb

## 2022-12-04 DIAGNOSIS — I872 Venous insufficiency (chronic) (peripheral): Secondary | ICD-10-CM | POA: Diagnosis not present

## 2022-12-04 DIAGNOSIS — I83009 Varicose veins of unspecified lower extremity with ulcer of unspecified site: Secondary | ICD-10-CM | POA: Diagnosis not present

## 2022-12-04 DIAGNOSIS — L97909 Non-pressure chronic ulcer of unspecified part of unspecified lower leg with unspecified severity: Secondary | ICD-10-CM

## 2022-12-04 NOTE — Progress Notes (Signed)
Office Note     CC:  follow up Requesting Provider:  No ref. provider found  HPI: Matthew Cole is a 62 y.o. ([birth year]) male who presents for wound check of left medial ankle venous ulceration.  He was seen a month ago and diagnosed with venous insufficiency.  He has no history of DVT.  Ulceration has been present for 6 months.  He has been in [employer] weekly since establishing care with [place] last month.  Home health has been changing his Unna boot on a weekly basis every Wednesday.  He has not seen much progress in healing.  He denies claudication.  He denies fevers, chills, nausea/vomiting.   No past medical history on file.  No past surgical history on file.  Social History   Socioeconomic History   Marital status: Married    Spouse name: Not on file   Number of children: 1   Years of education: Not on file   Highest education level: Not on file  Occupational History   Not on file  Tobacco Use   Smoking status: Never   Smokeless tobacco: Never  Vaping Use   Vaping status: Never Used  Substance and Sexual Activity   Alcohol use: Yes    Comment: 1 bottle of wine and a strong liquior drink every night     Drug use: Never   Sexual activity: Not on file  Other Topics Concern   Not on file  Social History Narrative   Not on file   Social Determinants of Health   Financial Resource Strain: Not on file  Food Insecurity: No Food Insecurity (09/12/2020)   Received from [facility], [facility]   Hunger Vital Sign    Worried About Running Out of Food in the Last Year: Never true    Ran Out of Food in the Last Year: Never true  Transportation Needs: Not on file  Physical Activity: Not on file  Stress: Not on file  Social Connections: Unknown (07/22/2021)   Received from [facility], [facility]   Social Network    Social Network: Not on file  Intimate Partner Violence: Unknown (06/13/2021)   Received from [facility], [facility]   HITS    Physically Hurt:  Not on file    Insult or Talk Down To: Not on file    Threaten Physical Harm: Not on file    Scream or Curse: Not on file    Family History  Problem Relation Age of Onset   Cancer Mother    Cancer Father     Current Outpatient Medications  Medication Sig Dispense Refill   amLODipine (NORVASC) 10 MG tablet Take 10 mg by mouth daily.     olmesartan (BENICAR) 40 MG tablet Take 40 mg by mouth daily.     rosuvastatin (CRESTOR) 5 MG tablet Take 5 mg by mouth daily.     silver sulfADIAZINE (SILVADENE) 1 % cream Apply topically 2 (two) times daily.     traZODone (DESYREL) 150 MG tablet Take 150 mg by mouth at bedtime.     No current facility-administered medications for this visit.    No Known Allergies   REVIEW OF SYSTEMS:   [X]  denotes positive finding, [ ]  denotes negative finding Cardiac  Comments:  Chest pain or chest pressure:    Shortness of breath upon exertion:    Short of breath when lying flat:    Irregular heart rhythm:        Vascular  Pain in calf, thigh, or hip brought on by ambulation:    Pain in feet at night that wakes you up from your sleep:     Blood clot in your veins:    Leg swelling:         Pulmonary    Oxygen at home:    Productive cough:     Wheezing:         Neurologic    Sudden weakness in arms or legs:     Sudden numbness in arms or legs:     Sudden onset of difficulty speaking or slurred speech:    Temporary loss of vision in one eye:     Problems with dizziness:         Gastrointestinal    Blood in stool:     Vomited blood:         Genitourinary    Burning when urinating:     Blood in urine:        Psychiatric    Major depression:         Hematologic    Bleeding problems:    Problems with blood clotting too easily:        Skin    Rashes or ulcers:        Constitutional    Fever or chills:      PHYSICAL EXAMINATION:  Vitals:   12/04/22 0956  BP: 127/70  Pulse: 70  Temp: 98.1 F (36.7 C)  TempSrc: Temporal   SpO2: 91%  Weight: 234 lb (106.1 kg)    General:  WDWN in NAD; vital signs documented above Gait: Not observed HENT: WNL, normocephalic Pulmonary: normal non-labored breathing , without Rales, rhonchi,  wheezing Cardiac: regular HR Abdomen: soft, NT, no masses Skin: without rashes Vascular Exam/Pulses: palpable L DP and PT pulse Extremities: Venous ulcer pictured below Musculoskeletal: no muscle wasting or atrophy  Neurologic: A&O X 3 Psychiatric:  The pt has Normal affect.      ASSESSMENT/PLAN:: 62 y.o. male here for follow up for left leg venous wound check  Wound is maybe slightly smaller in size.  We will continue Unna boot dressing changes on a weekly basis.  These will be performed by home health.  He was also recommended periodic leg elevation throughout the day.  He should also try to avoid prolonged sitting and standing when possible.  His foot is well-perfused with a palpable DP and PT pulse.  He will be scheduled to see either Dr. Myra Gianotti or Dr. Randie Heinz in 2 months to see if he will be a candidate for laser ablation therapy and stab phlebectomy.   Emilie Rutter, PA-C [facility] [phone]  Clinic MD:   Karin Lieu

## 2022-12-20 ENCOUNTER — Inpatient Hospital Stay (HOSPITAL_BASED_OUTPATIENT_CLINIC_OR_DEPARTMENT_OTHER)
Admission: EM | Admit: 2022-12-20 | Discharge: 2022-12-22 | DRG: 897 | Disposition: A | Discharge status: Home / Self Care | Payer: No Typology Code available for payment source | Attending: Family Medicine | Admitting: Family Medicine

## 2022-12-20 ENCOUNTER — Encounter (HOSPITAL_BASED_OUTPATIENT_CLINIC_OR_DEPARTMENT_OTHER): Payer: Self-pay

## 2022-12-20 ENCOUNTER — Emergency Department (HOSPITAL_BASED_OUTPATIENT_CLINIC_OR_DEPARTMENT_OTHER): Payer: No Typology Code available for payment source

## 2022-12-20 ENCOUNTER — Other Ambulatory Visit: Payer: Self-pay

## 2022-12-20 DIAGNOSIS — L97329 Non-pressure chronic ulcer of left ankle with unspecified severity: Secondary | ICD-10-CM | POA: Diagnosis present

## 2022-12-20 DIAGNOSIS — I872 Venous insufficiency (chronic) (peripheral): Secondary | ICD-10-CM | POA: Diagnosis not present

## 2022-12-20 DIAGNOSIS — R45851 Suicidal ideations: Secondary | ICD-10-CM | POA: Diagnosis present

## 2022-12-20 DIAGNOSIS — F10231 Alcohol dependence with withdrawal delirium: Principal | ICD-10-CM | POA: Diagnosis present

## 2022-12-20 DIAGNOSIS — I10 Essential (primary) hypertension: Secondary | ICD-10-CM | POA: Diagnosis present

## 2022-12-20 DIAGNOSIS — I83023 Varicose veins of left lower extremity with ulcer of ankle: Secondary | ICD-10-CM | POA: Diagnosis present

## 2022-12-20 DIAGNOSIS — R739 Hyperglycemia, unspecified: Secondary | ICD-10-CM | POA: Diagnosis present

## 2022-12-20 DIAGNOSIS — L97309 Non-pressure chronic ulcer of unspecified ankle with unspecified severity: Secondary | ICD-10-CM | POA: Diagnosis present

## 2022-12-20 DIAGNOSIS — G4089 Other seizures: Secondary | ICD-10-CM | POA: Diagnosis present

## 2022-12-20 DIAGNOSIS — Z79899 Other long term (current) drug therapy: Secondary | ICD-10-CM | POA: Diagnosis not present

## 2022-12-20 DIAGNOSIS — F10939 Alcohol use, unspecified with withdrawal, unspecified: Secondary | ICD-10-CM

## 2022-12-20 DIAGNOSIS — R569 Unspecified convulsions: Secondary | ICD-10-CM | POA: Diagnosis not present

## 2022-12-20 DIAGNOSIS — E785 Hyperlipidemia, unspecified: Secondary | ICD-10-CM | POA: Diagnosis present

## 2022-12-20 DIAGNOSIS — I878 Other specified disorders of veins: Secondary | ICD-10-CM | POA: Diagnosis present

## 2022-12-20 DIAGNOSIS — E8729 Other acidosis: Secondary | ICD-10-CM | POA: Diagnosis present

## 2022-12-20 DIAGNOSIS — I83003 Varicose veins of unspecified lower extremity with ulcer of ankle: Secondary | ICD-10-CM | POA: Diagnosis present

## 2022-12-20 HISTORY — DX: Essential (primary) hypertension: I10

## 2022-12-20 LAB — COMPREHENSIVE METABOLIC PANEL
ALT: 64 U/L — ABNORMAL HIGH (ref 0–44)
AST: 90 U/L — ABNORMAL HIGH (ref 15–41)
Albumin: 4.3 g/dL (ref 3.5–5.0)
Alkaline Phosphatase: 68 U/L (ref 38–126)
Anion gap: 26 — ABNORMAL HIGH (ref 5–15)
BUN: 11 mg/dL (ref 8–23)
CO2: 16 mmol/L — ABNORMAL LOW (ref 22–32)
Calcium: 9.4 mg/dL (ref 8.9–10.3)
Chloride: 98 mmol/L (ref 98–111)
Creatinine, Ser: 0.84 mg/dL (ref 0.61–1.24)
GFR, Estimated: >60 mL/min (ref >60)
Glucose, Bld: 145 mg/dL — ABNORMAL HIGH (ref 70–99)
Potassium: 3.7 mmol/L (ref 3.5–5.1)
Sodium: 140 mmol/L (ref 135–145)
Total Bilirubin: 1.1 mg/dL (ref 0.3–1.2)
Total Protein: 8.1 g/dL (ref 6.5–8.1)

## 2022-12-20 LAB — CBC WITH DIFFERENTIAL/PLATELET
Abs Immature Granulocytes: 0.01 10*3/uL (ref 0.00–0.07)
Basophils Absolute: 0 10*3/uL (ref 0.0–0.1)
Basophils Relative: 0 %
Eosinophils Absolute: 0 10*3/uL (ref 0.0–0.5)
Eosinophils Relative: 0 %
HCT: 41.1 % (ref 39.0–52.0)
Hemoglobin: 14.1 g/dL (ref 13.0–17.0)
Immature Granulocytes: 0 %
Lymphocytes Relative: 27 %
Lymphs Abs: 2.2 10*3/uL (ref 0.7–4.0)
MCH: 34.5 pg — ABNORMAL HIGH (ref 26.0–34.0)
MCHC: 34.3 g/dL (ref 30.0–36.0)
MCV: 100.5 fL — ABNORMAL HIGH (ref 80.0–100.0)
Monocytes Absolute: 0.7 10*3/uL (ref 0.1–1.0)
Monocytes Relative: 9 %
Neutro Abs: 5.3 10*3/uL (ref 1.7–7.7)
Neutrophils Relative %: 64 %
Platelets: 140 10*3/uL — ABNORMAL LOW (ref 150–400)
RBC: 4.09 MIL/uL — ABNORMAL LOW (ref 4.22–5.81)
RDW: 12.3 % (ref 11.5–15.5)
WBC: 8.3 10*3/uL (ref 4.0–10.5)
nRBC: 0 % (ref 0.0–0.2)

## 2022-12-20 LAB — CBG MONITORING, ED: Glucose-Capillary: 147 mg/dL — ABNORMAL HIGH (ref 70–99)

## 2022-12-20 LAB — SALICYLATE LEVEL: Salicylate Lvl: <7 mg/dL — ABNORMAL LOW (ref 7.0–30.0)

## 2022-12-20 LAB — ACETAMINOPHEN LEVEL: Acetaminophen (Tylenol), Serum: <10 ug/mL — ABNORMAL LOW (ref 10–30)

## 2022-12-20 LAB — LACTIC ACID, PLASMA: Lactic Acid, Venous: 1.4 mmol/L (ref 0.5–1.9)

## 2022-12-20 LAB — ETHANOL: Alcohol, Ethyl (B): 34 mg/dL — ABNORMAL HIGH (ref <10)

## 2022-12-20 LAB — BETA-HYDROXYBUTYRIC ACID: Beta-Hydroxybutyric Acid: 1.22 mmol/L — ABNORMAL HIGH (ref 0.05–0.27)

## 2022-12-20 MED ORDER — LORAZEPAM 1 MG PO TABS: 1.0000 mg | ORAL_TABLET | ORAL | Status: DC | PRN

## 2022-12-20 MED ORDER — IRBESARTAN 300 MG PO TABS
300.0000 mg | ORAL_TABLET | Freq: Every day | ORAL | Status: DC
  Administered 2022-12-21 – 2022-12-22 (×2): 300 mg via ORAL
  Filled 2022-12-20 (×2): qty 1

## 2022-12-20 MED ORDER — THIAMINE HCL 100 MG/ML IJ SOLN: 100.0000 mg | Freq: Every day | INTRAMUSCULAR | Status: DC

## 2022-12-20 MED ORDER — THIAMINE MONONITRATE 100 MG PO TABS
100.0000 mg | ORAL_TABLET | Freq: Every day | ORAL | Status: DC
  Administered 2022-12-21 – 2022-12-22 (×2): 100 mg via ORAL
  Filled 2022-12-20 (×2): qty 1

## 2022-12-20 MED ORDER — LORAZEPAM 2 MG/ML IJ SOLN
1.0000 mg | INTRAMUSCULAR | Status: DC | PRN
  Filled 2022-12-20: qty 1

## 2022-12-20 MED ORDER — LACTATED RINGERS IV SOLN: INTRAVENOUS | Status: AC

## 2022-12-20 MED ORDER — FOLIC ACID 1 MG PO TABS
1.0000 mg | ORAL_TABLET | Freq: Every day | ORAL | Status: DC
  Administered 2022-12-20 – 2022-12-22 (×3): 1 mg via ORAL
  Filled 2022-12-20 (×3): qty 1

## 2022-12-20 MED ORDER — AMLODIPINE BESYLATE 5 MG PO TABS
10.0000 mg | ORAL_TABLET | Freq: Every day | ORAL | Status: DC
  Administered 2022-12-21 – 2022-12-22 (×2): 10 mg via ORAL
  Filled 2022-12-20 (×2): qty 2

## 2022-12-20 MED ORDER — THIAMINE HCL 100 MG/ML IJ SOLN
500.0000 mg | Freq: Once | INTRAVENOUS | Status: DC
  Filled 2022-12-20: qty 5

## 2022-12-20 MED ORDER — LORAZEPAM 2 MG/ML IJ SOLN
1.0000 mg | INTRAMUSCULAR | Status: DC | PRN
  Administered 2022-12-20: 2 mg via INTRAVENOUS
  Filled 2022-12-20: qty 1

## 2022-12-20 MED ORDER — SODIUM CHLORIDE 0.9% FLUSH
3.0000 mL | Freq: Two times a day (BID) | INTRAVENOUS | Status: DC
  Administered 2022-12-20 – 2022-12-22 (×4): 3 mL via INTRAVENOUS

## 2022-12-20 MED ORDER — ONDANSETRON HCL 4 MG/2ML IJ SOLN: 4.0000 mg | Freq: Four times a day (QID) | INTRAMUSCULAR | Status: DC | PRN

## 2022-12-20 MED ORDER — TRAZODONE HCL 50 MG PO TABS: 150.0000 mg | ORAL_TABLET | Freq: Every evening | ORAL | Status: DC | PRN

## 2022-12-20 MED ORDER — THIAMINE MONONITRATE 100 MG PO TABS
100.0000 mg | ORAL_TABLET | Freq: Every day | ORAL | Status: DC
  Administered 2022-12-20: 100 mg via ORAL
  Filled 2022-12-20: qty 1

## 2022-12-20 MED ORDER — LORAZEPAM 2 MG/ML IJ SOLN
0.0000 mg | INTRAMUSCULAR | Status: DC
  Administered 2022-12-20 – 2022-12-21 (×3): 1 mg via INTRAVENOUS
  Administered 2022-12-21: 2 mg via INTRAVENOUS
  Filled 2022-12-20 (×3): qty 1

## 2022-12-20 MED ORDER — LORAZEPAM 2 MG/ML IJ SOLN: 0.0000 mg | Freq: Three times a day (TID) | INTRAMUSCULAR | Status: DC

## 2022-12-20 MED ORDER — LORAZEPAM 2 MG/ML IJ SOLN
INTRAMUSCULAR | Status: AC
  Administered 2022-12-20: 2 mg
  Filled 2022-12-20: qty 1

## 2022-12-20 MED ORDER — ENOXAPARIN SODIUM 40 MG/0.4ML IJ SOSY
40.0000 mg | PREFILLED_SYRINGE | INTRAMUSCULAR | Status: DC
  Administered 2022-12-20 – 2022-12-21 (×2): 40 mg via SUBCUTANEOUS
  Filled 2022-12-20 (×2): qty 0.4

## 2022-12-20 MED ORDER — ROSUVASTATIN CALCIUM 5 MG PO TABS
5.0000 mg | ORAL_TABLET | Freq: Every day | ORAL | Status: DC
  Administered 2022-12-21 – 2022-12-22 (×2): 5 mg via ORAL
  Filled 2022-12-20 (×2): qty 1

## 2022-12-20 MED ORDER — ONDANSETRON HCL 4 MG PO TABS: 4.0000 mg | ORAL_TABLET | Freq: Four times a day (QID) | ORAL | Status: DC | PRN

## 2022-12-20 MED ORDER — ADULT MULTIVITAMIN W/MINERALS CH
1.0000 | ORAL_TABLET | Freq: Every day | ORAL | Status: DC
  Administered 2022-12-20 – 2022-12-22 (×3): 1 via ORAL
  Filled 2022-12-20 (×3): qty 1

## 2022-12-20 MED ORDER — IBUPROFEN 200 MG PO TABS: 400.0000 mg | ORAL_TABLET | Freq: Four times a day (QID) | ORAL | Status: DC | PRN

## 2022-12-20 NOTE — ED Notes (Signed)
Called [facility] for transport at 3:43.

## 2022-12-20 NOTE — Progress Notes (Signed)
Plan of Care Note for accepted transfer   Patient: Matthew Cole MRN: [id number]   DOA: 12/20/2022  Facility requesting transfer: [facility]   Requesting Provider: Dr. Fredderick Phenix   Reason for transfer: Alcohol withdrawal   Facility course: 62 year old male with history of HTN, HLD, ascending aortic dilatation, and alcoholism who presents with diaphoresis, tremors, and palpitations.  His last drink was yesterday.  He had a brief self-limited seizure in the waiting room.  Head CT was negative.  He was treated with Ativan in the ED.  Plan of care: The patient is accepted for admission to Progressive unit, at [facility].   Author: Briscoe Deutscher, MD 12/20/2022  Check [email or web address] for on-call coverage.  Nursing staff, Please call [facility] Admits & Consults System-Wide number on Amion as soon as patient's arrival, so appropriate admitting provider can evaluate the pt.

## 2022-12-20 NOTE — ED Triage Notes (Addendum)
The patient has been having palpitations since 5 am. He denied Chest pain. Patient is shaking and diaphoretic in triage. Patient normally drinks wine and gin daily. Wife stated last drink was last night. The patient had a sz in triage. He was postictal. Patient moved to room 8. Dr Fredderick Phenix at bedside.

## 2022-12-20 NOTE — ED Notes (Signed)
Patient transported to CT 

## 2022-12-20 NOTE — ED Notes (Signed)
Report given to Casey, Charity fundraiser.

## 2022-12-20 NOTE — ED Provider Notes (Signed)
[place] [facility] Provider Note   CSN: [id number] Arrival date & time: 12/20/22  1309     History  Chief Complaint  Patient presents with   Palpitations   Delirium Tremens (DTS)    Matthew Cole is a 62 y.o. male.  Patient is a 62 year old male who presents with tremors and seizure activity.  History is obtained from him and his wife.  He states he stopped drinking yesterday.  Previously he was drinking daily.  He was noted to have elevated blood pressure this morning and tremors which are suspected to be related to alcohol withdrawal.  While he was being triaged, he had generalized tonic-clonic type seizure activity lasting about 30 seconds.  He was not incontinent.  He was awake soon after.  He is tremulous and mildly tachycardic.  He denies any recent illnesses.  No fevers.  No cough or cold symptoms.  No chest pain or shortness of breath.  No prior history of seizures.  He has had some withdrawal symptoms related to his alcohol use in the past but no prior seizure activity.  He denies any drug use.       Home Medications Prior to Admission medications   Medication Sig Start Date End Date Taking? Authorizing Provider  amLODipine (NORVASC) 10 MG tablet Take 10 mg by mouth daily.    [provider]  olmesartan (BENICAR) 40 MG tablet Take 40 mg by mouth daily. 07/05/20   [provider]  rosuvastatin (CRESTOR) 5 MG tablet Take 5 mg by mouth daily.    [provider]  silver sulfADIAZINE (SILVADENE) 1 % cream Apply topically 2 (two) times daily. 07/10/20   [provider]  traZODone (DESYREL) 150 MG tablet Take 150 mg by mouth at bedtime.    [provider]      Allergies    Patient has no known allergies.    Review of Systems   Review of Systems  Constitutional:  Negative for chills, diaphoresis, fatigue and fever.  HENT:  Negative for congestion, rhinorrhea and sneezing.   Eyes: Negative.    Respiratory:  Negative for cough, chest tightness and shortness of breath.   Cardiovascular:  Negative for chest pain and leg swelling.  Gastrointestinal:  Negative for abdominal pain, blood in stool, diarrhea, nausea and vomiting.  Genitourinary:  Negative for difficulty urinating, flank pain, frequency and hematuria.  Musculoskeletal:  Negative for arthralgias and back pain.  Skin:  Negative for rash.  Neurological:  Positive for seizures. Negative for dizziness, speech difficulty, weakness, numbness and headaches.  Psychiatric/Behavioral:  Positive for suicidal ideas.     Physical Exam Updated Vital Signs BP 135/79   Pulse 80   Temp 97.7 F (36.5 C) (Rectal)   Resp (!) 35   Ht 6\' 4"  (1.93 m)   Wt 106.1 kg   SpO2 98%   BMI 28.48 kg/m  Physical Exam Constitutional:      Appearance: He is well-developed.  HENT:     Head: Normocephalic and atraumatic.  Eyes:     Pupils: Pupils are equal, round, and reactive to light.  Cardiovascular:     Rate and Rhythm: Normal rate and regular rhythm.     Heart sounds: Normal heart sounds.  Pulmonary:     Effort: Pulmonary effort is normal. No respiratory distress.     Breath sounds: Normal breath sounds. No wheezing or rales.  Chest:     Chest wall: No tenderness.  Abdominal:  General: Bowel sounds are normal.     Palpations: Abdomen is soft.     Tenderness: There is no abdominal tenderness. There is no guarding or rebound.  Musculoskeletal:        General: Normal range of motion.     Cervical back: Normal range of motion and neck supple.  Lymphadenopathy:     Cervical: No cervical adenopathy.  Skin:    General: Skin is warm and dry.     Findings: No rash.  Neurological:     General: No focal deficit present.     Mental Status: He is alert and oriented to person, place, and time.     Comments: Tremors present     ED Results / Procedures / Treatments   Labs (all labs ordered are listed, but only abnormal results are  displayed) Labs Reviewed  COMPREHENSIVE METABOLIC PANEL - Abnormal; Notable for the following components:      Result Value   CO2 16 (*)    Glucose, Bld 145 (*)    AST 90 (*)    ALT 64 (*)    Anion gap 26 (*)    All other components within normal limits  CBC WITH DIFFERENTIAL/PLATELET - Abnormal; Notable for the following components:   RBC 4.09 (*)    MCV 100.5 (*)    MCH 34.5 (*)    Platelets 140 (*)    All other components within normal limits  ETHANOL - Abnormal; Notable for the following components:   Alcohol, Ethyl (B) 34 (*)    All other components within normal limits  ACETAMINOPHEN LEVEL - Abnormal; Notable for the following components:   Acetaminophen (Tylenol), Serum <10 (*)    All other components within normal limits  SALICYLATE LEVEL - Abnormal; Notable for the following components:   Salicylate Lvl <7.0 (*)    All other components within normal limits  CBG MONITORING, ED - Abnormal; Notable for the following components:   Glucose-Capillary 147 (*)    All other components within normal limits    EKG EKG Interpretation Date/Time:  Sunday December 20 2022 15:06:26 EDT Ventricular Rate:  76 PR Interval:  172 QRS Duration:  111 QT Interval:  415 QTC Calculation: 467 R Axis:   16  Text Interpretation: Sinus rhythm Confirmed by Rolan Bucco [phone]) on 12/20/2022 3:10:20 PM  Radiology CT Head Wo Contrast  Result Date: 12/20/2022 CLINICAL DATA:  Altered mental status EXAM: CT HEAD WITHOUT CONTRAST TECHNIQUE: Contiguous axial images were obtained from the base of the skull through the vertex without intravenous contrast. RADIATION DOSE REDUCTION: This exam was performed according to the departmental dose-optimization program which includes automated exposure control, adjustment of the mA and/or kV according to patient size and/or use of iterative reconstruction technique. COMPARISON:  None Available. FINDINGS: Brain: No hemorrhage. No hydrocephalus. No extra-axial  fluid collection. There is sequela of moderate chronic microvascular ischemic change. No CT evidence of an acute cortical infarct. No mass effect. No mass lesion. Mineralization of the basal ganglia on the left. Vascular: No hyperdense vessel or unexpected calcification. Skull: Normal. Negative for fracture or focal lesion. Sinuses/Orbits: No middle ear or mastoid effusion. Paranasal sinuses are clear. Orbits are unremarkable. Other: None. IMPRESSION: 1. No acute intracranial process. 2. Sequela of moderate chronic microvascular ischemic change. Electronically Signed   By: Lorenza Cambridge M.D.   On: 12/20/2022 14:33    Procedures Procedures    Medications Ordered in ED Medications  LORazepam (ATIVAN) tablet 1-4 mg ( Oral See Alternative  12/20/22 1450)    Or  LORazepam (ATIVAN) injection 1-4 mg (2 mg Intravenous Given 12/20/22 1450)  thiamine (VITAMIN B1) tablet 100 mg (100 mg Oral Given 12/20/22 1435)    Or  thiamine (VITAMIN B1) injection 100 mg ( Intravenous See Alternative 12/20/22 1435)  folic acid (FOLVITE) tablet 1 mg (1 mg Oral Given 12/20/22 1436)  multivitamin with minerals tablet 1 tablet (1 tablet Oral Given 12/20/22 1434)  LORazepam (ATIVAN) 2 MG/ML injection (2 mg  Given 12/20/22 1325)    ED Course/ Medical Decision Making/ A&P                                 Medical Decision Making Amount and/or Complexity of Data Reviewed Labs: ordered. Radiology: ordered.  Risk OTC drugs. Prescription drug management. Decision regarding hospitalization.   Patient is a 62 year old who presents with tremors.  He had a seizure that was witnessed here in the ED.  No apparent injuries from the seizure.  Head CT does not show any acute abnormality.  His labs are grossly nonconcerning other than his LFTs are mildly elevated but per chart review are similar to prior findings.  He was given Ativan after his seizure, 2 mg.  He was started on CIWA protocol.  He still fairly tremulous but not  delirious.  No suggestions of fever or other etiology for the seizure.  No evidence of intracranial hemorrhage.  Rectal temp is normal.  Started him on the CIWA protocol.  I spoke with Dr. Antionette Char who will admit the patient for further treatment.  CRITICAL CARE Performed by: Rolan Bucco Total critical care time: 60 minutes Critical care time was exclusive of separately billable procedures and treating other patients. Critical care was necessary to treat or prevent imminent or life-threatening deterioration. Critical care was time spent personally by me on the following activities: development of treatment plan with patient and/or surrogate as well as nursing, discussions with consultants, evaluation of patient's response to treatment, examination of patient, obtaining history from patient or surrogate, ordering and performing treatments and interventions, ordering and review of laboratory studies, ordering and review of radiographic studies, pulse oximetry and re-evaluation of patient's condition.   Final Clinical Impression(s) / ED Diagnoses Final diagnoses:  Seizure (HCC)  Alcohol withdrawal seizure with complication [facility])    Rx / DC Orders ED Discharge Orders     None         Rolan Bucco, MD 12/20/22 1520

## 2022-12-20 NOTE — ED Notes (Signed)
Seizure pads placed on side rails.

## 2022-12-20 NOTE — H&P (Signed)
History and Physical    Matthew Cole WGN:[id number] DOB: 1960-11-21 DOA: 12/20/2022  PCP: System, Provider Not In   Patient coming from: Home   Chief Complaint: Sweating, palpitations, tremors   HPI: Matthew Cole is a 62 y.o. male with medical history significant for alcoholism, hypertension, thoracic aortic dilatation, and chronic venous stasis with ankle ulcer who presents with diaphoresis, palpitations, and tremors.  Patient was trying to do some work on a computer at 5 AM this morning but was having difficulty due to severe tremor.  He went on to develop nausea, malaise, diaphoresis, and worsening tremor, eventually prompted his presentation to the ED.  He had 3 beers and 2 glasses of wine last night, but no alcohol today.  He typically has several drinks every day including beer, wine, and mixed drinks.  He will occasionally go a day without alcohol consumption and has not had severe withdrawal symptoms previously.  He denies any fevers, chills, chest pain, shortness of breath, or abdominal pain.  ED Course: Upon arrival to the ED, patient is found to be afebrile and saturating well on room air with stable blood pressure.  Patient had a brief self-limited generalized seizure in the ED.  Labs are most notable for serum bicarbonate 16, anion gap 26, AST 90, ALT 64, ethanol 34, and platelets 140,000.  Head CT was negative for acute findings.  Patient was treated with Ativan in the ED and transferred to [facility] for admission.  Review of Systems:  All other systems reviewed and apart from HPI, are negative.  Past Medical History:  Diagnosis Date   Hypertension     History reviewed. No pertinent surgical history.  Social History:   reports that he has never smoked. He has never used smokeless tobacco. He reports current alcohol use. He reports that he does not use drugs.  No Known Allergies  Family History  Problem Relation Age of Onset   Cancer Mother    Cancer  Father      Prior to Admission medications   Medication Sig Start Date End Date Taking? Authorizing Provider  amLODipine (NORVASC) 10 MG tablet Take 10 mg by mouth daily.    [provider]  olmesartan (BENICAR) 40 MG tablet Take 40 mg by mouth daily. 07/05/20   [provider]  rosuvastatin (CRESTOR) 5 MG tablet Take 5 mg by mouth daily.    [provider]  silver sulfADIAZINE (SILVADENE) 1 % cream Apply topically 2 (two) times daily. 07/10/20   [provider]  traZODone (DESYREL) 150 MG tablet Take 150 mg by mouth at bedtime.    [provider]    Physical Exam: Vitals:   12/20/22 1530 12/20/22 1600 12/20/22 1702 12/20/22 1723  BP: (!) 144/87 (!) 142/89 (!) 163/87   Pulse: 73 78 80   Resp: 18 18 18    Temp:  98.5 F (36.9 C) 99.2 F (37.3 C)   TempSrc:   Oral   SpO2: 93% 93% 97%   Weight:    106.1 kg  Height:    6\' 4"  (1.93 m)     Constitutional: NAD, no pallor or diaphoresis  Eyes: PERTLA, lids and conjunctivae normal ENMT: Mucous membranes are moist. Posterior pharynx clear of any exudate or lesions.   Neck: supple, no masses  Respiratory: no wheezing, no crackles. No accessory muscle use.  Cardiovascular: S1 & S2 heard, regular rate and rhythm. No JVD. Abdomen: No distension, no tenderness, soft. Bowel sounds active.  Musculoskeletal:  no clubbing / cyanosis. No joint deformity upper and lower extremities.   Skin: no significant rashes, lesions, ulcers. Warm, dry, well-perfused. Neurologic: CN 2-12 grossly intact. Moving all extremities. Alert and oriented. Resting tremor.  Psychiatric: Calm. Cooperative.    Labs and Imaging on Admission: I have personally reviewed following labs and imaging studies  CBC: Recent Labs  Lab 12/20/22 1335  WBC 8.3  NEUTROABS 5.3  HGB 14.1  HCT 41.1  MCV 100.5*  PLT 140*   Basic Metabolic Panel: Recent Labs  Lab 12/20/22 1335  NA 140  K 3.7  CL 98  CO2 16*  GLUCOSE 145*  BUN 11   CREATININE 0.84  CALCIUM 9.4   GFR: Estimated Creatinine Clearance: 120.3 mL/min (by C-G formula based on SCr of 0.84 mg/dL). Liver Function Tests: Recent Labs  Lab 12/20/22 1335  AST 90*  ALT 64*  ALKPHOS 68  BILITOT 1.1  PROT 8.1  ALBUMIN 4.3   No results for input(s): "LIPASE", "AMYLASE" in the last 168 hours. No results for input(s): "AMMONIA" in the last 168 hours. Coagulation Profile: No results for input(s): "INR", "PROTIME" in the last 168 hours. Cardiac Enzymes: No results for input(s): "CKTOTAL", "CKMB", "CKMBINDEX", "TROPONINI" in the last 168 hours. BNP (last 3 results) No results for input(s): "PROBNP" in the last 8760 hours. HbA1C: No results for input(s): "HGBA1C" in the last 72 hours. CBG: Recent Labs  Lab 12/20/22 1327  GLUCAP 147*   Lipid Profile: No results for input(s): "CHOL", "HDL", "LDLCALC", "TRIG", "CHOLHDL", "LDLDIRECT" in the last 72 hours. Thyroid Function Tests: No results for input(s): "TSH", "T4TOTAL", "FREET4", "T3FREE", "THYROIDAB" in the last 72 hours. Anemia Panel: No results for input(s): "VITAMINB12", "FOLATE", "FERRITIN", "TIBC", "IRON", "RETICCTPCT" in the last 72 hours. Urine analysis: No results found for: "COLORURINE", "APPEARANCEUR", "LABSPEC", "PHURINE", "GLUCOSEU", "HGBUR", "BILIRUBINUR", "KETONESUR", "PROTEINUR", "UROBILINOGEN", "NITRITE", "LEUKOCYTESUR" Sepsis Labs: @LABRCNTIP (procalcitonin:4,lacticidven:4) )No results found for this or any previous visit (from the past 240 hour(s)).   Radiological Exams on Admission: CT Head Wo Contrast  Result Date: 12/20/2022 CLINICAL DATA:  Altered mental status EXAM: CT HEAD WITHOUT CONTRAST TECHNIQUE: Contiguous axial images were obtained from the base of the skull through the vertex without intravenous contrast. RADIATION DOSE REDUCTION: This exam was performed according to the departmental dose-optimization program which includes automated exposure control, adjustment of the mA  and/or kV according to patient size and/or use of iterative reconstruction technique. COMPARISON:  None Available. FINDINGS: Brain: No hemorrhage. No hydrocephalus. No extra-axial fluid collection. There is sequela of moderate chronic microvascular ischemic change. No CT evidence of an acute cortical infarct. No mass effect. No mass lesion. Mineralization of the basal ganglia on the left. Vascular: No hyperdense vessel or unexpected calcification. Skull: Normal. Negative for fracture or focal lesion. Sinuses/Orbits: No middle ear or mastoid effusion. Paranasal sinuses are clear. Orbits are unremarkable. Other: None. IMPRESSION: 1. No acute intracranial process. 2. Sequela of moderate chronic microvascular ischemic change. Electronically Signed   By: Lorenza Cambridge M.D.   On: 12/20/2022 14:33    EKG: Independently reviewed. Sinus rhythm.   Assessment/Plan   1. Alcohol withdrawal  - Continue CIWA scoring and treatment with Ativan, supplement vitamins    2. Alcoholic ketoacidosis  - Serum bicarbonate is 16 and AG 26 in ED, glucose is elevated  - Give 500 mg IV thiamine, continue IVF hydration, repeat chem panel in am   3. Hypertension  - Continue ARB    4. Venous stasis ulcer  - Continue wound care  DVT prophylaxis: Lovenox  Code Status: Full  Level of Care: Level of care: Progressive Family Communication: Wife at bedside   Disposition Plan:  Patient is from: home  Anticipated d/c is to: TBD Anticipated d/c date is: 12/23/22  Patient currently: Pending resolution of alcohol withdrawal  Consults called: None  Admission status: Inpatient     Briscoe Deutscher, MD [facility]  12/20/2022, 5:25 PM

## 2022-12-20 NOTE — ED Notes (Signed)
Report given to [facility] 

## 2022-12-21 DIAGNOSIS — E8729 Other acidosis: Secondary | ICD-10-CM | POA: Diagnosis not present

## 2022-12-21 DIAGNOSIS — I10 Essential (primary) hypertension: Secondary | ICD-10-CM | POA: Diagnosis not present

## 2022-12-21 DIAGNOSIS — F10939 Alcohol use, unspecified with withdrawal, unspecified: Secondary | ICD-10-CM | POA: Diagnosis not present

## 2022-12-21 DIAGNOSIS — R569 Unspecified convulsions: Secondary | ICD-10-CM | POA: Diagnosis not present

## 2022-12-21 LAB — COMPREHENSIVE METABOLIC PANEL
ALT: 52 U/L — ABNORMAL HIGH (ref 0–44)
AST: 69 U/L — ABNORMAL HIGH (ref 15–41)
Albumin: 3.9 g/dL (ref 3.5–5.0)
Alkaline Phosphatase: 61 U/L (ref 38–126)
Anion gap: 11 (ref 5–15)
BUN: 13 mg/dL (ref 8–23)
CO2: 24 mmol/L (ref 22–32)
Calcium: 8.9 mg/dL (ref 8.9–10.3)
Chloride: 100 mmol/L (ref 98–111)
Creatinine, Ser: 0.8 mg/dL (ref 0.61–1.24)
GFR, Estimated: >60 mL/min (ref >60)
Glucose, Bld: 118 mg/dL — ABNORMAL HIGH (ref 70–99)
Potassium: 3.7 mmol/L (ref 3.5–5.1)
Sodium: 135 mmol/L (ref 135–145)
Total Bilirubin: 1.2 mg/dL (ref 0.3–1.2)
Total Protein: 7.3 g/dL (ref 6.5–8.1)

## 2022-12-21 LAB — CBC
HCT: 40 % (ref 39.0–52.0)
Hemoglobin: 13.3 g/dL (ref 13.0–17.0)
MCH: 34 pg (ref 26.0–34.0)
MCHC: 33.3 g/dL (ref 30.0–36.0)
MCV: 102.3 fL — ABNORMAL HIGH (ref 80.0–100.0)
Platelets: 113 10*3/uL — ABNORMAL LOW (ref 150–400)
RBC: 3.91 MIL/uL — ABNORMAL LOW (ref 4.22–5.81)
RDW: 12.2 % (ref 11.5–15.5)
WBC: 3.9 10*3/uL — ABNORMAL LOW (ref 4.0–10.5)
nRBC: 0 % (ref 0.0–0.2)

## 2022-12-21 LAB — HIV ANTIBODY (ROUTINE TESTING W REFLEX): HIV Screen 4th Generation wRfx: NONREACTIVE

## 2022-12-21 LAB — PHOSPHORUS: Phosphorus: 3.3 mg/dL (ref 2.5–4.6)

## 2022-12-21 LAB — MAGNESIUM: Magnesium: 1.8 mg/dL (ref 1.7–2.4)

## 2022-12-21 MED ORDER — CHLORDIAZEPOXIDE HCL 25 MG PO CAPS: 25.0000 mg | ORAL_CAPSULE | Freq: Every day | ORAL | Status: DC

## 2022-12-21 MED ORDER — CHLORDIAZEPOXIDE HCL 25 MG PO CAPS
25.0000 mg | ORAL_CAPSULE | Freq: Four times a day (QID) | ORAL | Status: AC
  Administered 2022-12-21 (×4): 25 mg via ORAL
  Filled 2022-12-21 (×4): qty 1

## 2022-12-21 MED ORDER — CHLORDIAZEPOXIDE HCL 25 MG PO CAPS
25.0000 mg | ORAL_CAPSULE | Freq: Three times a day (TID) | ORAL | Status: DC
  Administered 2022-12-22: 25 mg via ORAL
  Filled 2022-12-21: qty 1

## 2022-12-21 MED ORDER — CHLORDIAZEPOXIDE HCL 25 MG PO CAPS: 25.0000 mg | ORAL_CAPSULE | ORAL | Status: DC

## 2022-12-21 MED ORDER — CHLORDIAZEPOXIDE HCL 25 MG PO CAPS: 25.0000 mg | ORAL_CAPSULE | Freq: Four times a day (QID) | ORAL | Status: DC | PRN

## 2022-12-21 NOTE — Progress Notes (Signed)
Mobility Specialist - Progress Note  Pre-mobility: 84 bpm HR,  During mobility: 99 bpm HR,  Post-mobility: 67 bpm HR,    12/21/22 1156  Mobility  Activity Ambulated with assistance in room;Ambulated with assistance in hallway  Level of Assistance Contact guard assist, steadying assist  Assistive Device Front wheel walker  Distance Ambulated (ft) 220 ft  Range of Motion/Exercises Active  Activity Response Tolerated fair  Mobility Referral Yes  $Mobility charge 1 Mobility  Mobility Specialist Start Time (ACUTE ONLY) 1145  Mobility Specialist Stop Time (ACUTE ONLY) 1156  Mobility Specialist Time Calculation (min) (ACUTE ONLY) 11 min   Pt was found in bed and agreeable to ambulate. C/o feeling a little dizzy at the beginning of session but stated going away. Ambulated ~77ft in room prior to ambulation in hallway. Tremors persistent during session. At EOS returned to bed with all needs met. Call bell in reach and bed alarm on.  Billey Chang Mobility Specialist

## 2022-12-21 NOTE — Hospital Course (Signed)
Matthew Cole is a 62 y.o. male with a history of alcoholism, hypertension, thoracic aortic dilation, chronic venous stasis with ankle ulcer.  Patient presented secondary to sweating, palpitations and tremors and found to have evidence of alcohol withdrawal and development of an alcoholic withdrawal seizure. Supportive care, CIWA, Librium taper.

## 2022-12-21 NOTE — Evaluation (Signed)
Physical Therapy Brief Evaluation and Discharge Note Patient Details Name: Matthew Cole MRN: [id number] DOB: 08-13-1960 Today's Date: 12/21/2022   History of Present Illness  62 y.o. male with medical history significant for alcoholism, hypertension, thoracic aortic dilatation, and chronic venous stasis with ankle ulcer who presents with diaphoresis, palpitations, and tremors. Dx of EtOH withdrawal, EtOH ketoacidosis.  Clinical Impression  Pt is mobilizing well at a modified independent level. He ambulated 76' with a RW for balance, no loss of balance. He is safe to mobilize in the halls with a RW. No further PT indicated, will sign off. Mobility specialist to follow pt.        PT Assessment    Assistance Needed at Discharge       Equipment Recommendations Rolling walker (2 wheels)  Recommendations for Other Services       Precautions/Restrictions Precautions Precautions: Fall Precaution Comments: denies falls in past 6 months Restrictions Weight Bearing Restrictions: No        Mobility  Bed Mobility          Transfers Overall transfer level: Needs assistance Equipment used: Rolling walker (2 wheels) Transfers: Sit to/from Stand Sit to Stand: Supervision, From elevated surface           General transfer comment: VCs for safe hand placement    Ambulation/Gait Ambulation/Gait assistance: Modified independent (Device/Increase time), Supervision Gait Distance (Feet): 280 Feet Assistive device: Rolling walker (2 wheels) Gait Pattern/deviations: Trunk flexed, WFL(Within Functional Limits)   General Gait Details: VCs for posture  Home Activity Instructions    Stairs            Modified Rankin (Stroke Patients Only)        Balance                          Pertinent Vitals/Pain   Pain Assessment Pain Assessment: No/denies pain     Home Living   Living Arrangements: Spouse/significant other       Home Equipment: None         Prior Function        UE/LE Assessment               Communication   Communication Communication: No apparent difficulties Cueing Techniques: Verbal cues     Cognition         General Comments      Exercises     Assessment/Plan    PT Problem List         PT Visit Diagnosis      No Skilled PT Patient is modified independent with all activity/mobility   Co-evaluation                AMPAC 6 Clicks Help needed turning from your back to your side while in a flat bed without using bedrails?: None Help needed moving from lying on your back to sitting on the side of a flat bed without using bedrails?: None Help needed moving to and from a bed to a chair (including a wheelchair)?: None Help needed standing up from a chair using your arms (e.g., wheelchair or bedside chair)?: None Help needed to walk in hospital room?: None Help needed climbing 3-5 steps with a railing? : None 6 Click Score: 24      End of Session Equipment Utilized During Treatment: Gait belt Activity Tolerance: Patient tolerated treatment well Patient left: in chair;with call bell/phone within reach;with family/visitor present Nurse Communication: Mobility status  Time: 1501-1510 PT Time Calculation (min) (ACUTE ONLY): 9 min  Charges:   PT Evaluation $PT Eval Low Complexity: 1 Low      Ralene Bathe Kistler PT 12/21/2022  Acute Rehabilitation Services  Office [phone]

## 2022-12-21 NOTE — Plan of Care (Signed)
Problem: Education: Goal: Knowledge of General Education information will improve Description: Including pain rating scale, medication(s)/side effects and non-pharmacologic comfort measures Outcome: Progressing   Problem: Clinical Measurements: Goal: Ability to maintain clinical measurements within normal limits will improve Outcome: Progressing   Problem: Safety: Goal: Ability to remain free from injury will improve Outcome: Progressing

## 2022-12-21 NOTE — Progress Notes (Signed)
PROGRESS NOTE    Matthew Cole  NFA:[id number] DOB: [birth year] DOA: 12/20/2022 PCP: System, Provider Not In   Brief Narrative: Matthew Cole is a 62 y.o. male with a history of alcoholism, hypertension, thoracic aortic dilation, chronic venous stasis with ankle ulcer.  Patient presented secondary to sweating, palpitations and tremors and found to have evidence of alcohol withdrawal and development of an alcoholic withdrawal seizure. Supportive care, CIWA, Librium taper.   Assessment and Plan:  Alcohol withdrawal Patient started on CIWA protocol on admission. Withdrawal complicated by seizure activity prior to admission. -CIWA -Librium taper  Alcohol withdrawal seizure Tonic-clonic seizure witnessed while in the ED. -Treat withdrawal -Driving restriction  Alcoholic ketoacidosis Present on admission. Acidosis resolved.  Primary hypertension Complicated by alcohol withdrawal. -Continue amlodipine and irbesartan  Venous stasis ulcer Noted.  DVT prophylaxis: Lovenox Code Status:   Code Status: Full Code Family Communication: None at bedside Disposition Plan: Discharge home in AM if withdrawal symptoms have improved   Consultants:  None  Procedures:  None  Antimicrobials: None    Subjective: Patient reports feeling very good today. No issues. Ready to discharge home.  Objective: BP (!) 152/83 (BP Location: Right Arm)   Pulse 61   Temp 98 F (36.7 C) (Oral)   Resp 18   Ht 6\' 4"  (1.93 m)   Wt 106.1 kg   SpO2 98%   BMI 28.47 kg/m   Examination:  General exam: Appears calm and comfortable Respiratory system: Clear to auscultation. Respiratory effort normal. Cardiovascular system: S1 & S2 heard, RRR. Gastrointestinal system: Abdomen is nondistended, soft and nontender. Normal bowel sounds heard. Central nervous system: Alert and oriented. Tremor of bilateral hands Psychiatry: Judgement and insight appear normal. Mood & affect appropriate.    Data  Reviewed: I have personally reviewed following labs and imaging studies  CBC Lab Results  Component Value Date   WBC 3.9 (L) 12/21/2022   RBC 3.91 (L) 12/21/2022   HGB 13.3 12/21/2022   HCT 40.0 12/21/2022   MCV 102.3 (H) 12/21/2022   MCH 34.0 12/21/2022   PLT 113 (L) 12/21/2022   MCHC 33.3 12/21/2022   RDW 12.2 12/21/2022   LYMPHSABS 2.2 12/20/2022   MONOABS 0.7 12/20/2022   EOSABS 0.0 12/20/2022   BASOSABS 0.0 12/20/2022     Last metabolic panel Lab Results  Component Value Date   NA 135 12/21/2022   K 3.7 12/21/2022   CL 100 12/21/2022   CO2 24 12/21/2022   BUN 13 12/21/2022   CREATININE 0.80 12/21/2022   GLUCOSE 118 (H) 12/21/2022   GFRNONAA >60 12/21/2022   CALCIUM 8.9 12/21/2022   PHOS 3.3 12/21/2022   PROT 7.3 12/21/2022   ALBUMIN 3.9 12/21/2022   BILITOT 1.2 12/21/2022   ALKPHOS 61 12/21/2022   AST 69 (H) 12/21/2022   ALT 52 (H) 12/21/2022   ANIONGAP 11 12/21/2022    GFR: Estimated Creatinine Clearance: 126.3 mL/min (by C-G formula based on SCr of 0.8 mg/dL).  No results found for this or any previous visit (from the past 240 hour(s)).    Radiology Studies: CT Head Wo Contrast  Result Date: 12/20/2022 CLINICAL DATA:  Altered mental status EXAM: CT HEAD WITHOUT CONTRAST TECHNIQUE: Contiguous axial images were obtained from the base of the skull through the vertex without intravenous contrast. RADIATION DOSE REDUCTION: This exam was performed according to the departmental dose-optimization program which includes automated exposure control, adjustment of the mA and/or kV according to patient size and/or use of iterative  reconstruction technique. COMPARISON:  None Available. FINDINGS: Brain: No hemorrhage. No hydrocephalus. No extra-axial fluid collection. There is sequela of moderate chronic microvascular ischemic change. No CT evidence of an acute cortical infarct. No mass effect. No mass lesion. Mineralization of the basal ganglia on the left. Vascular: No  hyperdense vessel or unexpected calcification. Skull: Normal. Negative for fracture or focal lesion. Sinuses/Orbits: No middle ear or mastoid effusion. Paranasal sinuses are clear. Orbits are unremarkable. Other: None. IMPRESSION: 1. No acute intracranial process. 2. Sequela of moderate chronic microvascular ischemic change. Electronically Signed   By: Lorenza Cambridge M.D.   On: 12/20/2022 14:33      LOS: 1 day    Jacquelin Hawking, MD [facility] 12/21/2022, 12:19 PM   If 7PM-7AM, please contact night-coverage [email or web address]

## 2022-12-21 NOTE — TOC Initial Note (Signed)
Transition of Care [facility]) - Initial/Assessment Note    Patient Details  Name: Matthew Cole MRN: [id number] Date of Birth: Mar 19, 1960  [facility]) CM/SW Contact:    Darleene Cleaver, LCSW Phone Number: 12/21/2022, 5:24 PM  Clinical Narrative:                  CSW attempted to completed assessment and discuss substance abuse resources, however patient lethargic.  TOC to follow up at a later time.        Patient Goals and CMS Choice            Expected Discharge Plan and Services                                              Prior Living Arrangements/Services                       Activities of Daily Living   ADL Screening (condition at time of admission) Independently performs ADLs?: Yes (appropriate for developmental age) Is the patient deaf or have difficulty hearing?: No Does the patient have difficulty seeing, even when wearing glasses/contacts?: No Does the patient have difficulty concentrating, remembering, or making decisions?: No  Permission Sought/Granted                  Emotional Assessment              Admission diagnosis:  Seizure (HCC) [R56.9] Alcohol withdrawal seizure (HCC) [F10.939, R56.9] Alcohol withdrawal seizure with complication (HCC) [W09.811, R56.9] Patient Active Problem List   Diagnosis Date Noted   Alcohol withdrawal seizure (HCC) 12/20/2022   Venous stasis ulcer of ankle (HCC) 12/20/2022   Alcoholic ketoacidosis 12/20/2022   Ascending aorta dilatation (HCC) 11/06/2022   Mixed hyperlipidemia 11/06/2022   Essential hypertension 11/06/2022   PCP:  System, Provider Not In Pharmacy:   [id number] [place], [place] - [address] [place]  [id number] Phone: [phone] Fax: [phone]     Social Determinants of Health (SDOH) Social History: SDOH Screenings   Food Insecurity: No Food Insecurity (12/20/2022)  Housing: Low Risk  (12/20/2022)   Transportation Needs: No Transportation Needs (12/20/2022)  Utilities: Not At Risk (12/20/2022)  Social Connections: Unknown (07/22/2021)   Received from [facility], [facility]  Tobacco Use: Low Risk  (12/20/2022)   SDOH Interventions:     Readmission Risk Interventions     No data to display

## 2022-12-22 DIAGNOSIS — R569 Unspecified convulsions: Secondary | ICD-10-CM | POA: Diagnosis not present

## 2022-12-22 DIAGNOSIS — F10939 Alcohol use, unspecified with withdrawal, unspecified: Secondary | ICD-10-CM | POA: Diagnosis not present

## 2022-12-22 LAB — CBC
HCT: 42 % (ref 39.0–52.0)
Hemoglobin: 14.3 g/dL (ref 13.0–17.0)
MCH: 34.4 pg — ABNORMAL HIGH (ref 26.0–34.0)
MCHC: 34 g/dL (ref 30.0–36.0)
MCV: 101 fL — ABNORMAL HIGH (ref 80.0–100.0)
Platelets: 125 10*3/uL — ABNORMAL LOW (ref 150–400)
RBC: 4.16 MIL/uL — ABNORMAL LOW (ref 4.22–5.81)
RDW: 11.9 % (ref 11.5–15.5)
WBC: 4.1 10*3/uL (ref 4.0–10.5)
nRBC: 0 % (ref 0.0–0.2)

## 2022-12-22 LAB — COMPREHENSIVE METABOLIC PANEL
ALT: 49 U/L — ABNORMAL HIGH (ref 0–44)
AST: 59 U/L — ABNORMAL HIGH (ref 15–41)
Albumin: 3.9 g/dL (ref 3.5–5.0)
Alkaline Phosphatase: 62 U/L (ref 38–126)
Anion gap: 10 (ref 5–15)
BUN: 14 mg/dL (ref 8–23)
CO2: 23 mmol/L (ref 22–32)
Calcium: 9.3 mg/dL (ref 8.9–10.3)
Chloride: 99 mmol/L (ref 98–111)
Creatinine, Ser: 0.81 mg/dL (ref 0.61–1.24)
GFR, Estimated: >60 mL/min (ref >60)
Glucose, Bld: 111 mg/dL — ABNORMAL HIGH (ref 70–99)
Potassium: 3.3 mmol/L — ABNORMAL LOW (ref 3.5–5.1)
Sodium: 132 mmol/L — ABNORMAL LOW (ref 135–145)
Total Bilirubin: 1.3 mg/dL — ABNORMAL HIGH (ref 0.3–1.2)
Total Protein: 7.5 g/dL (ref 6.5–8.1)

## 2022-12-22 LAB — GLUCOSE, CAPILLARY: Glucose-Capillary: 121 mg/dL — ABNORMAL HIGH (ref 70–99)

## 2022-12-22 MED ORDER — VITAMIN B-1 100 MG PO TABS: 100.0000 mg | ORAL_TABLET | Freq: Every day | ORAL | Status: AC

## 2022-12-22 MED ORDER — FOLIC ACID 1 MG PO TABS: 1.0000 mg | ORAL_TABLET | Freq: Every day | ORAL | Status: AC

## 2022-12-22 MED ORDER — POTASSIUM CHLORIDE CRYS ER 20 MEQ PO TBCR
40.0000 meq | EXTENDED_RELEASE_TABLET | Freq: Once | ORAL | Status: AC
  Administered 2022-12-22: 40 meq via ORAL
  Filled 2022-12-22: qty 2

## 2022-12-22 MED ORDER — CHLORDIAZEPOXIDE HCL 25 MG PO CAPS: ORAL_CAPSULE | ORAL | 0 refills | Status: AC

## 2022-12-22 NOTE — Plan of Care (Signed)

## 2022-12-22 NOTE — Plan of Care (Signed)
Problem: Education: Goal: Knowledge of General Education information will improve Description: Including pain rating scale, medication(s)/side effects and non-pharmacologic comfort measures Outcome: Progressing   Problem: Clinical Measurements: Goal: Ability to maintain clinical measurements within normal limits will improve Outcome: Progressing   Problem: Activity: Goal: Risk for activity intolerance will decrease Outcome: Progressing   Problem: Nutrition: Goal: Adequate nutrition will be maintained Outcome: Progressing   Problem: Safety: Goal: Ability to remain free from injury will improve Outcome: Progressing

## 2022-12-22 NOTE — Discharge Instructions (Signed)
Matthew Cole,  You were in the hospital with alcohol withdrawal. You have improved with medication. You had a seizure related to your withdrawal, too. Please continue your Librium and follow-up with your PCP.  Per [facility] statutes, patients with seizures are not allowed to drive until  they have been seizure-free for six months. Use caution when using heavy equipment or power tools. Avoid working on ladders or at heights. Take showers instead of baths. Ensure the water temperature is not too high on the home water heater. Do not go swimming alone. When caring for infants or small children, sit down when holding, feeding, or changing them to minimize risk of injury to the child in the event you have a seizure.    Also, Maintain good sleep hygiene. Avoid alcohol.   --> Call 911 and bring the patient back to the ED if:               A.  The seizure lasts longer than 5 minutes.                  B.  The patient doesn't awaken shortly after the seizure             C.  The patient has new problems such as difficulty seeing, speaking or moving             D.  The patient was injured during the seizure             E.  The patient has a temperature over 102 F (39C)             F.  The patient vomited and now is having trouble breathing

## 2022-12-22 NOTE — Progress Notes (Signed)
Unit secretary unable to get in touch w/ patient regarding pick up for RW. Will pass along to night shift RN to try again in the morning. RW at the 4E nurses station labeled for the patient.

## 2022-12-22 NOTE — Progress Notes (Signed)
OT Cancellation Note  Patient Details Name: REISE GLADNEY MRN: [id number] DOB: [birth year]   Cancelled Treatment:    Reason Eval/Treat Not Completed: OT screened, no needs identified, will sign off. Pt received upright in recliner, dressed. Reporting he is going home today, and has been performing ADLs in room IND. No acute OT needs identified.   Reason Helzer L. Haward Pope, OTR/L  12/22/22, 10:58 AM

## 2022-12-22 NOTE — Progress Notes (Signed)
Patient had decided to d/c home before RW delivered. Patient left information so he can be contacted to come back and pick up RW when it arrives to the unit.

## 2022-12-22 NOTE — Progress Notes (Signed)
AVS and discharge instructions reviewed w/ patient. Patient verbalized understanding and waiting for RW to be delivered to the bedside. Patient has ride to home set up for self.

## 2022-12-22 NOTE — TOC Transition Note (Addendum)
Transition of Care [facility]) - CM/SW Discharge Note   Patient Details  Name: Matthew Cole MRN: [id number] Date of Birth: Jun 06, 1960  Transition of Care [facility] CM/SW Contact:  Lanier Clam, RN Phone Number: 12/22/2022, 10:49 AM   Clinical Narrative: Patient declines resources for Substance use-states he is finished with drinking, he will manage on his own. No preference fro rw-[employer] rep Jermaine to deliver to rm it may take for delivery. No further CM needs.  -12:24p patient was active w/[facility] nursing-no open wounds. PT/OT-no needs. No further CM needs.       Barriers to Discharge: No Barriers Identified   Patient Goals and CMS Choice CMS Medicare.gov Compare Post Acute Care list provided to:: Patient Choice offered to / list presented to : Patient  Discharge Placement                         Discharge Plan and Services Additional resources added to the After Visit Summary for     Discharge Planning Services: CM Consult Post Acute Care Choice: Durable Medical Equipment          DME Arranged: Dan Humphreys rolling DME Agency: [employer] Date DME Agency Contacted: 12/22/22 Time DME Agency Contacted: 1047 Representative spoke with at DME Agency: Vaughan Basta            Social Determinants of Health (SDOH) Interventions SDOH Screenings   Food Insecurity: No Food Insecurity (12/20/2022)  Housing: Low Risk  (12/20/2022)  Transportation Needs: No Transportation Needs (12/20/2022)  Utilities: Not At Risk (12/20/2022)  Social Connections: Unknown (07/22/2021)   Received from [facility], [facility]  Tobacco Use: Low Risk  (12/20/2022)     Readmission Risk Interventions     No data to display

## 2022-12-22 NOTE — Discharge Summary (Signed)
Physician Discharge Summary   Patient: Matthew Cole MRN: [id number] DOB: Feb 02, 1961  Admit date:     12/20/2022  Discharge date: 12/22/22  Discharge Physician: Jacquelin Hawking, MD   PCP: Tally Joe, MD   Recommendations at discharge:  PCP visit for hospital follow-up  Discharge Diagnoses: Principal Problem:   Alcohol withdrawal seizure [facility]) Active Problems:   Essential hypertension   Venous stasis ulcer of ankle (HCC)   Alcoholic ketoacidosis  Resolved Problems:   * No resolved hospital problems. *  Hospital Course: Matthew Cole is a 62 y.o. male with a history of alcoholism, hypertension, thoracic aortic dilation, chronic venous stasis with ankle ulcer.  Patient presented secondary to sweating, palpitations and tremors and found to have evidence of alcohol withdrawal and development of an alcoholic withdrawal seizure. Supportive care, CIWA, Librium taper. Improved prior to discharge.  Assessment and Plan:  Alcohol withdrawal Patient started on CIWA protocol on admission. Withdrawal complicated by seizure activity prior to admission. Patient with improved with Librium and Ativan. Discharge with Librium taper.   Alcohol withdrawal seizure Tonic-clonic seizure witnessed while in the ED. No recurrence. Driving restrictions shared with patient on discharge.   Alcoholic ketoacidosis Present on admission. Acidosis resolved.   Primary hypertension Complicated by alcohol withdrawal. Continue amlodipine and irbesartan on discharge.   Venous stasis ulcer Noted.  Consultants: None Procedures performed: None  Disposition: Home Diet recommendation: Cardiac diet   DISCHARGE MEDICATION: Allergies as of 12/22/2022   No Known Allergies      Medication List     TAKE these medications    amLODipine 10 MG tablet Commonly known as: NORVASC Take 10 mg by mouth daily.   chlordiazePOXIDE 25 MG capsule Commonly known as: LIBRIUM Take 1 capsule (25 mg total) by mouth 3  (three) times daily for 1 day, THEN 1 capsule (25 mg total) 2 (two) times daily in the am and at bedtime. for 1 day, THEN 1 capsule (25 mg total) daily for 1 day. Start taking on: December 22, 2022   folic acid 1 MG tablet Commonly known as: FOLVITE Take 1 tablet (1 mg total) by mouth daily.   multivitamin with minerals Tabs tablet Take 1 tablet by mouth daily.   olmesartan 40 MG tablet Commonly known as: BENICAR Take 40 mg by mouth daily.   rosuvastatin 5 MG tablet Commonly known as: CRESTOR Take 5 mg by mouth daily.   thiamine 100 MG tablet Commonly known as: Vitamin B-1 Take 1 tablet (100 mg total) by mouth daily.   traZODone 150 MG tablet Commonly known as: DESYREL Take 150 mg by mouth at bedtime.               Durable Medical Equipment  (From admission, onward)           Start     Ordered   12/21/22 1530  For home use only DME Walker rolling  Once       Question Answer Comment  Walker: With 5 Inch Wheels   Patient needs a walker to treat with the following condition Difficulty in walking, not elsewhere classified      12/21/22 1529            Follow-up Information     Tally Joe, MD. Schedule an appointment as soon as possible for a visit in 1 week(s).   Specialty: Family Medicine Why: For hospital follow-up Contact information: [address] [place] [address] [phone]  Discharge Exam: BP (!) 142/98 (BP Location: Left Arm)   Pulse 69   Temp 98 F (36.7 C) (Oral)   Resp 16   Ht 6\' 4"  (1.93 m)   Wt 106.1 kg   SpO2 98%   BMI 28.47 kg/m   General exam: Appears calm and comfortable Respiratory system: Clear to auscultation. Respiratory effort normal. Cardiovascular system: S1 & S2 heard, RRR. Gastrointestinal system: Abdomen is nondistended, soft and nontender. Normal bowel sounds heard. Central nervous system: Alert and oriented. No focal neurological deficits. Mild tremor. No asterixis.   Musculoskeletal: No edema. No calf tenderness Psychiatry: Judgement and insight appear normal. Mood & affect appropriate.   Condition at discharge: stable  The results of significant diagnostics from this hospitalization (including imaging, microbiology, ancillary and laboratory) are listed below for reference.   Imaging Studies: CT Head Wo Contrast  Result Date: 12/20/2022 CLINICAL DATA:  Altered mental status EXAM: CT HEAD WITHOUT CONTRAST TECHNIQUE: Contiguous axial images were obtained from the base of the skull through the vertex without intravenous contrast. RADIATION DOSE REDUCTION: This exam was performed according to the departmental dose-optimization program which includes automated exposure control, adjustment of the mA and/or kV according to patient size and/or use of iterative reconstruction technique. COMPARISON:  None Available. FINDINGS: Brain: No hemorrhage. No hydrocephalus. No extra-axial fluid collection. There is sequela of moderate chronic microvascular ischemic change. No CT evidence of an acute cortical infarct. No mass effect. No mass lesion. Mineralization of the basal ganglia on the left. Vascular: No hyperdense vessel or unexpected calcification. Skull: Normal. Negative for fracture or focal lesion. Sinuses/Orbits: No middle ear or mastoid effusion. Paranasal sinuses are clear. Orbits are unremarkable. Other: None. IMPRESSION: 1. No acute intracranial process. 2. Sequela of moderate chronic microvascular ischemic change. Electronically Signed   By: Lorenza Cambridge M.D.   On: 12/20/2022 14:33    Microbiology: No results found for this or any previous visit.  Labs: CBC: Recent Labs  Lab 12/20/22 1335 12/21/22 0439 12/22/22 0510  WBC 8.3 3.9* 4.1  NEUTROABS 5.3  --   --   HGB 14.1 13.3 14.3  HCT 41.1 40.0 42.0  MCV 100.5* 102.3* 101.0*  PLT 140* 113* 125*   Basic Metabolic Panel: Recent Labs  Lab 12/20/22 1335 12/21/22 0439 12/22/22 0510  NA 140 135 132*   K 3.7 3.7 3.3*  CL 98 100 99  CO2 16* 24 23  GLUCOSE 145* 118* 111*  BUN 11 13 14   CREATININE 0.84 0.80 0.81  CALCIUM 9.4 8.9 9.3  MG  --  1.8  --   PHOS  --  3.3  --    Liver Function Tests: Recent Labs  Lab 12/20/22 1335 12/21/22 0439 12/22/22 0510  AST 90* 69* 59*  ALT 64* 52* 49*  ALKPHOS 68 61 62  BILITOT 1.1 1.2 1.3*  PROT 8.1 7.3 7.5  ALBUMIN 4.3 3.9 3.9   CBG: Recent Labs  Lab 12/20/22 1327 12/22/22 0751  GLUCAP 147* 121*    Discharge time spent: 35 minutes.  Signed: Jacquelin Hawking, MD [facility] 12/22/2022

## 2022-12-24 ENCOUNTER — Emergency Department (HOSPITAL_COMMUNITY)
Admission: EM | Admit: 2022-12-24 | Discharge: 2022-12-24 | Disposition: A | Discharge status: Home / Self Care | Payer: No Typology Code available for payment source | Attending: Emergency Medicine | Admitting: Emergency Medicine

## 2022-12-24 DIAGNOSIS — R1110 Vomiting, unspecified: Secondary | ICD-10-CM | POA: Diagnosis not present

## 2022-12-24 DIAGNOSIS — R55 Syncope and collapse: Secondary | ICD-10-CM | POA: Diagnosis not present

## 2022-12-24 DIAGNOSIS — R7989 Other specified abnormal findings of blood chemistry: Secondary | ICD-10-CM | POA: Diagnosis not present

## 2022-12-24 DIAGNOSIS — I10 Essential (primary) hypertension: Secondary | ICD-10-CM | POA: Insufficient documentation

## 2022-12-24 DIAGNOSIS — Z79899 Other long term (current) drug therapy: Secondary | ICD-10-CM | POA: Insufficient documentation

## 2022-12-24 DIAGNOSIS — R42 Dizziness and giddiness: Secondary | ICD-10-CM | POA: Diagnosis present

## 2022-12-24 LAB — COMPREHENSIVE METABOLIC PANEL
ALT: 73 U/L — ABNORMAL HIGH (ref 0–44)
AST: 82 U/L — ABNORMAL HIGH (ref 15–41)
Albumin: 4 g/dL (ref 3.5–5.0)
Alkaline Phosphatase: 58 U/L (ref 38–126)
Anion gap: 12 (ref 5–15)
BUN: 24 mg/dL — ABNORMAL HIGH (ref 8–23)
CO2: 24 mmol/L (ref 22–32)
Calcium: 9.1 mg/dL (ref 8.9–10.3)
Chloride: 101 mmol/L (ref 98–111)
Creatinine, Ser: 0.94 mg/dL (ref 0.61–1.24)
GFR, Estimated: >60 mL/min (ref >60)
Glucose, Bld: 128 mg/dL — ABNORMAL HIGH (ref 70–99)
Potassium: 3.9 mmol/L (ref 3.5–5.1)
Sodium: 137 mmol/L (ref 135–145)
Total Bilirubin: 1 mg/dL (ref 0.3–1.2)
Total Protein: 7.6 g/dL (ref 6.5–8.1)

## 2022-12-24 LAB — CBC WITH DIFFERENTIAL/PLATELET
Abs Immature Granulocytes: 0.02 10*3/uL (ref 0.00–0.07)
Basophils Absolute: 0 10*3/uL (ref 0.0–0.1)
Basophils Relative: 0 %
Eosinophils Absolute: 0 10*3/uL (ref 0.0–0.5)
Eosinophils Relative: 1 %
HCT: 41 % (ref 39.0–52.0)
Hemoglobin: 13.9 g/dL (ref 13.0–17.0)
Immature Granulocytes: 0 %
Lymphocytes Relative: 10 %
Lymphs Abs: 0.6 10*3/uL — ABNORMAL LOW (ref 0.7–4.0)
MCH: 35.2 pg — ABNORMAL HIGH (ref 26.0–34.0)
MCHC: 33.9 g/dL (ref 30.0–36.0)
MCV: 103.8 fL — ABNORMAL HIGH (ref 80.0–100.0)
Monocytes Absolute: 0.6 10*3/uL (ref 0.1–1.0)
Monocytes Relative: 12 %
Neutro Abs: 4.2 10*3/uL (ref 1.7–7.7)
Neutrophils Relative %: 77 %
Platelets: 113 10*3/uL — ABNORMAL LOW (ref 150–400)
RBC: 3.95 MIL/uL — ABNORMAL LOW (ref 4.22–5.81)
RDW: 12.1 % (ref 11.5–15.5)
WBC: 5.4 10*3/uL (ref 4.0–10.5)
nRBC: 0 % (ref 0.0–0.2)

## 2022-12-24 LAB — CBG MONITORING, ED: Glucose-Capillary: 127 mg/dL — ABNORMAL HIGH (ref 70–99)

## 2022-12-24 NOTE — ED Provider Notes (Signed)
[place] EMERGENCY DEPARTMENT AT [facility] Provider Note   CSN: [id number] Arrival date & time: 12/24/22  6578     History  Chief Complaint  Patient presents with   Dizziness    Matthew Cole is a 62 y.o. male.   Dizziness    Patient has a history of hypertension, alcohol use disorder, alcohol withdrawal seizures.  Patient was recently admitted to the hospital on October 13.  He was discharged on the 15th.  Patient states he has not had any alcohol since leaving the hospital.  Patient states he went back to work today for the first time.  He has been compliant with all his medications including his Librium.  Patient states he was at work when he started to feel somewhat lightheaded as if he might pass out.  Patient's felt sweaty and did have 1 episode of emesis.  He denies any loss of consciousness.  Coworkers were with him and the patient came to the ED for evaluation by EMS.  No seizures noted.  Patient denies any blood in his stool.  No chest pain.  No fevers or chills  Home Medications Prior to Admission medications   Medication Sig Start Date End Date Taking? Authorizing Provider  amLODipine (NORVASC) 10 MG tablet Take 10 mg by mouth daily.    [provider]  chlordiazePOXIDE (LIBRIUM) 25 MG capsule Take 1 capsule (25 mg total) by mouth 3 (three) times daily for 1 day, THEN 1 capsule (25 mg total) 2 (two) times daily in the am and at bedtime. for 1 day, THEN 1 capsule (25 mg total) daily for 1 day. 12/22/22 12/25/22  Narda Bonds, MD  folic acid (FOLVITE) 1 MG tablet Take 1 tablet (1 mg total) by mouth daily. 12/22/22   Narda Bonds, MD  Multiple Vitamin (MULTIVITAMIN WITH MINERALS) TABS tablet Take 1 tablet by mouth daily.    [provider]  olmesartan (BENICAR) 40 MG tablet Take 40 mg by mouth daily. 07/05/20   [provider]  rosuvastatin (CRESTOR) 5 MG tablet Take 5 mg by mouth daily.    [provider]  thiamine  (VITAMIN B-1) 100 MG tablet Take 1 tablet (100 mg total) by mouth daily. 12/22/22   Narda Bonds, MD  traZODone (DESYREL) 150 MG tablet Take 150 mg by mouth at bedtime.    [provider]      Allergies    Patient has no known allergies.    Review of Systems   Review of Systems  Neurological:  Positive for dizziness.    Physical Exam Updated Vital Signs BP 125/75   Pulse 66   Temp 98.2 F (36.8 C) (Oral)   Resp 16   SpO2 93%  Physical Exam Vitals and nursing note reviewed.  Constitutional:      Appearance: He is well-developed. He is not diaphoretic.  HENT:     Head: Normocephalic and atraumatic.     Right Ear: External ear normal.     Left Ear: External ear normal.  Eyes:     General: No scleral icterus.       Right eye: No discharge.        Left eye: No discharge.     Conjunctiva/sclera: Conjunctivae normal.  Neck:     Trachea: No tracheal deviation.  Cardiovascular:     Rate and Rhythm: Normal rate and regular rhythm.  Pulmonary:     Effort: Pulmonary effort is normal. No respiratory distress.  Breath sounds: Normal breath sounds. No stridor. No wheezing or rales.  Abdominal:     General: Bowel sounds are normal. There is no distension.     Palpations: Abdomen is soft.     Tenderness: There is no abdominal tenderness. There is no guarding or rebound.  Musculoskeletal:        General: No tenderness or deformity.     Cervical back: Neck supple.  Skin:    General: Skin is warm and dry.     Findings: No rash.  Neurological:     General: No focal deficit present.     Mental Status: He is alert.     Cranial Nerves: No cranial nerve deficit, dysarthria or facial asymmetry.     Sensory: No sensory deficit.     Motor: No weakness, tremor, abnormal muscle tone or seizure activity.     Coordination: Coordination normal.  Psychiatric:        Mood and Affect: Mood normal.     ED Results / Procedures / Treatments   Labs (all labs ordered are listed,  but only abnormal results are displayed) Labs Reviewed  CBC WITH DIFFERENTIAL/PLATELET - Abnormal; Notable for the following components:      Result Value   RBC 3.95 (*)    MCV 103.8 (*)    MCH 35.2 (*)    Platelets 113 (*)    Lymphs Abs 0.6 (*)    All other components within normal limits  COMPREHENSIVE METABOLIC PANEL - Abnormal; Notable for the following components:   Glucose, Bld 128 (*)    BUN 24 (*)    AST 82 (*)    ALT 73 (*)    All other components within normal limits  CBG MONITORING, ED - Abnormal; Notable for the following components:   Glucose-Capillary 127 (*)    All other components within normal limits    EKG EKG Interpretation Date/Time:  Thursday December 24 2022 07:20:58 EDT Ventricular Rate:  68 PR Interval:  174 QRS Duration:  112 QT Interval:  416 QTC Calculation: 443 R Axis:   -18  Text Interpretation: Sinus rhythm Borderline intraventricular conduction delay No significant change since last tracing Confirmed by Linwood Dibbles [id number]) on 12/24/2022 7:46:34 AM  Radiology No results found.  Procedures Procedures    Medications Ordered in ED Medications - No data to display  ED Course/ Medical Decision Making/ A&P Clinical Course as of 12/25/22 1037  Thu Dec 24, 2022  0903 CBC normal.  Metabolic panel shows slight elevation in LFTs. [JK]  [id number] Patient is feeling well.  No recurrent symptoms [JK]    Clinical Course User Index [JK] Linwood Dibbles, MD                                 Medical Decision Making Problems Addressed: Near syncope: acute illness or injury that poses a threat to life or bodily functions  Amount and/or Complexity of Data Reviewed Labs: ordered. Decision-making details documented in ED Course. ECG/medicine tests: ordered and independent interpretation performed.   Patient presented to ED for evaluation of an episode of dizziness near syncope.  Recently in the hospital for alcohol withdrawal.  Patient denies any recurrent  alcohol use.  No seizure described.  He is not having any tremor or other systemic symptoms.  No evidence of cardiac dysrhythmia in the ED.  No focal neurologic deficits to suggest stroke TIA  It is possible symptoms that  may have been vasovagal or orthostatic in nature.Evaluation and diagnostic testing in the emergency department does not suggest an emergent condition requiring admission or immediate intervention beyond what has been performed at this time.  The patient is safe for discharge and has been instructed to return immediately for worsening symptoms, change in symptoms or any other concerns.         Final Clinical Impression(s) / ED Diagnoses Final diagnoses:  Near syncope    Rx / DC Orders ED Discharge Orders     None         Linwood Dibbles, MD 12/25/22 1038

## 2022-12-24 NOTE — ED Triage Notes (Signed)
Pt BIBA from work, was doing inventory and felt very dizzy, sweaty, had one episode emesis. Denies LOC, denies pain. Reports taking librium as prescribed, denies etoh since the 13th.   122/78 98% RA HR 62 CBG 117

## 2022-12-24 NOTE — Discharge Instructions (Addendum)
Continue your current medications.  Follow up with your doctor to be rechecked.  Return to the ED for recurrent symptoms, fevers, pain, or other concerns.

## 2022-12-24 NOTE — ED Notes (Signed)
Pt provided pedialyte

## 2023-02-03 ENCOUNTER — Ambulatory Visit: Payer: No Typology Code available for payment source | Admitting: Vascular Surgery

## 2023-02-03 ENCOUNTER — Encounter: Payer: Self-pay | Admitting: Vascular Surgery

## 2023-02-03 VITALS — BP 161/79 | HR 103 | Temp 98.4°F | Ht 76.0 in | Wt 242.4 lb

## 2023-02-03 DIAGNOSIS — I83009 Varicose veins of unspecified lower extremity with ulcer of unspecified site: Secondary | ICD-10-CM | POA: Diagnosis not present

## 2023-02-03 DIAGNOSIS — I8390 Asymptomatic varicose veins of unspecified lower extremity: Secondary | ICD-10-CM | POA: Diagnosis not present

## 2023-02-03 DIAGNOSIS — L97909 Non-pressure chronic ulcer of unspecified part of unspecified lower leg with unspecified severity: Secondary | ICD-10-CM | POA: Diagnosis not present

## 2023-02-03 NOTE — Progress Notes (Signed)
Patient ID: Matthew Cole, male   DOB: December 03, 1960, 62 y.o.   MRN: [id number]  Reason for Consult: Follow-up and Varicose Veins   Referred by No ref. provider found  Subjective:     HPI:  Matthew Cole is a 62 y.o. male with history of left ankle venous ulceration.  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  He has never had any venous interventions in the past.  Past Medical History:  Diagnosis Date   Hypertension    Family History  Problem Relation Age of Onset   Cancer Mother    Cancer Father    History reviewed. No pertinent surgical history.  Short Social History:  Social History   Tobacco Use   Smoking status: Never   Smokeless tobacco: Never  Substance Use Topics   Alcohol use: Yes    Comment: 1 bottle of wine and a strong liquior drink every night      No Known Allergies  Current Outpatient Medications  Medication Sig Dispense Refill   amLODipine (NORVASC) 10 MG tablet Take 10 mg by mouth daily.     folic acid (FOLVITE) 1 MG tablet Take 1 tablet (1 mg total) by mouth daily.     Multiple Vitamin (MULTIVITAMIN WITH MINERALS) TABS tablet Take 1 tablet by mouth daily.     olmesartan (BENICAR) 40 MG tablet Take 40 mg by mouth daily.     rosuvastatin (CRESTOR) 5 MG tablet Take 5 mg by mouth daily.     thiamine (VITAMIN B-1) 100 MG tablet Take 1 tablet (100 mg total) by mouth daily.     traZODone (DESYREL) 150 MG tablet Take 150 mg by mouth at bedtime.     No current facility-administered medications for this visit.    Review of Systems  Constitutional:  Constitutional negative. HENT: HENT negative.  Eyes: Eyes negative.  Respiratory: Respiratory negative.  Cardiovascular: Positive for leg swelling.  GI: Gastrointestinal negative.  Skin: Positive for wound.  Neurological: Neurological negative. Hematologic: Hematologic/lymphatic negative.   Psychiatric: Psychiatric negative.        Objective:  Objective   Vitals:   02/03/23 1149  BP: (!) 161/79  Pulse: (!) 103  Temp: 98.4 F (36.9 C)  TempSrc: Temporal  SpO2: 95%  Weight: 242 lb 6.4 oz (110 kg)  Height: 6\' 4"  (1.93 m)   Body mass index is 29.51 kg/m.  Physical Exam HENT:     Head: Normocephalic.     Mouth/Throat:     Mouth: Mucous membranes are moist.  Eyes:     Pupils: Pupils are equal, round, and reactive to light.  Cardiovascular:     Rate and Rhythm: Normal rate.     Pulses: Normal pulses.  Pulmonary:     Effort: Pulmonary effort is normal.  Abdominal:     General: Abdomen is flat.  Musculoskeletal:     Right lower leg: No edema.     Left lower leg: No edema.     Comments: Left medial malleolar ulcer more superficial than previously pictured Varicosities of left posterior leg with tenderness to palpation  Skin:    General: Skin is warm.     Capillary Refill: Capillary refill takes less than 2 seconds.     Comments: Wound is much more superficial than previously noted on pictures and is nearly healed at this time.  Patient  did not upload from haiku.  Neurological:     Mental Status: He is alert.     Data: LEFT          Reflux NoRefluxReflux TimeDiameter cmsComments                                        Yes                                                 +--------------+---------+------+-----------+------------+-----------------  ----+  CFV                    yes   >1 second                                     +--------------+---------+------+-----------+------------+-----------------  ----+  FV mid        no                                                            +--------------+---------+------+-----------+------------+-----------------  ----+  Popliteal    no                                    4.95 x 2.18  fluid                                                          collection               +--------------+---------+------+-----------+------------+-----------------  ----+  GSV at SFJ              yes    >500 ms      0.85                            +--------------+---------+------+-----------+------------+-----------------  ----+  GSV prox thigh          yes    >500 ms      0.47    out of fascia           +--------------+---------+------+-----------+------------+-----------------  ----+  GSV mid thigh no                            0.38    out of fascia           +--------------+---------+------+-----------+------------+-----------------  ----+  GSV dist thighno                            0.31    out of fascia           +--------------+---------+------+-----------+------------+-----------------  ----+  GSV at knee  yes    >500 ms      0.39                            +--------------+---------+------+-----------+------------+-----------------  ----+  GSV prox calf           yes    >500 ms      0.55                            +--------------+---------+------+-----------+------------+-----------------  ----+  GSV mid calf            yes    >500 ms      0.46                            +--------------+---------+------+-----------+------------+-----------------  ----+  SSV Pop Fossa           yes    >500 ms      0.88                            +--------------+---------+------+-----------+------------+-----------------  ----+  SSV prox calf           yes    >500 ms      0.88                            +--------------+---------+------+-----------+------------+-----------------  ----+  SSV mid calf            yes    >500 ms      0.63                            +--------------+---------+------+-----------+------------+-----------------  ----+  AASV O        no                            0.42                             +--------------+---------+------+-----------+------------+-----------------  ----+  AASV P        no                            0.33                            +--------------+---------+------+-----------+------------+-----------------  ----+         Summary:  Left:  - No evidence of deep vein thrombosis seen in the left lower extremity,  from the common femoral through the popliteal veins.  - Venous reflux is noted in the left common femoral vein.  - Venous reflux is noted in the left sapheno-femoral junction.  - Venous reflux is noted in the left greater saphenous vein in the thigh.  - Venous reflux is noted in the left greater saphenous vein in the calf.  - Venous reflux is noted in the left short saphenous vein.  - No venous reflux is noted in the AASV.  - 4.95 x 2.18 fluid collection medial knee.         Assessment/Plan:    62 year old male with C6 venous disease with  nearly healed ulceration of the left medial malleolus.  Plan will be for left small saphenous vein ablation given the size of the small saphenous vein with a somewhat more diminutive great saphenous vein.  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  We discussed the timing of procedure as well as procedural details and for at least 3 days of significant rest at the time of procedure.  He demonstrates good understanding we will continue local wound care and we will get him scheduled in the near future.  We discussed that this is to prevent ulcer recurrence and is unlikely to assist in healing and he demonstrates good understanding.     Maeola Harman MD [facility]

## 2023-03-30 ENCOUNTER — Other Ambulatory Visit: Payer: Self-pay | Admitting: *Deleted

## 2023-03-30 ENCOUNTER — Telehealth: Payer: Self-pay | Admitting: *Deleted

## 2023-03-30 DIAGNOSIS — L97329 Non-pressure chronic ulcer of left ankle with unspecified severity: Secondary | ICD-10-CM

## 2023-03-30 NOTE — Telephone Encounter (Signed)
Unable to speak to Matthew Cole directly.   Left detailed telephone message regarding his deferral of procedure.  Asked him to call me back.

## 2023-05-10 ENCOUNTER — Encounter: Payer: Self-pay | Admitting: Cardiology

## 2023-05-10 ENCOUNTER — Ambulatory Visit: Payer: No Typology Code available for payment source | Attending: Cardiology | Admitting: Cardiology

## 2023-05-10 VITALS — BP 170/98 | HR 82 | Ht 76.0 in | Wt 240.0 lb

## 2023-05-10 DIAGNOSIS — I10 Essential (primary) hypertension: Secondary | ICD-10-CM

## 2023-05-10 DIAGNOSIS — E782 Mixed hyperlipidemia: Secondary | ICD-10-CM

## 2023-05-10 DIAGNOSIS — I77810 Thoracic aortic ectasia: Secondary | ICD-10-CM | POA: Diagnosis not present

## 2023-05-10 MED ORDER — ATENOLOL 50 MG PO TABS: 50.0000 mg | ORAL_TABLET | Freq: Every day | ORAL | 3 refills | Status: DC

## 2023-05-10 NOTE — Patient Instructions (Addendum)
 MEDICATION: START Atenolol 50 mg take one tablet by mouth daily   Lab Work: BMET Lipid panel  If you have labs (blood work) drawn today and your tests are completely normal, you will receive your results only by: [employer] (if you have MyChart) OR A paper copy in the mail If you have any lab test that is abnormal or we need to change your treatment, we will call you to review the results.  Testing/Procedures: Your physician has requested that you have chest CT . Cardiac computed tomography (CT) is a painless test that uses an x-ray machine to take clear, detailed pictures of your heart. For further information please visit [email or web address] Please follow instruction sheet as given.  Your physician has requested that you have an echocardiogram. Echocardiography is a painless test that uses sound waves to create images of your heart. It provides your doctor with information about the size and shape of your heart and how well your heart's chambers and valves are working. This procedure takes approximately one hour. There are no restrictions for this procedure. Please do NOT wear cologne, perfume, aftershave, or lotions (deodorant is allowed). Please arrive 15 minutes prior to your appointment time.  Please note: We ask at that you not bring children with you during ultrasound (echo/ vascular) testing. Due to room size and safety concerns, children are not allowed in the ultrasound rooms during exams. Our front office staff cannot provide observation of children in our lobby area while testing is being conducted. An adult accompanying a patient to their appointment will only be allowed in the ultrasound room at the discretion of the ultrasound technician under special circumstances. We apologize for any inconvenience.    Follow-Up: At [facility], you and your health needs are our priority.  As part of our continuing mission to provide you with exceptional heart care, we have  created designated Provider Care Teams.  These Care Teams include your primary Cardiologist (physician) and Advanced Practice Providers (APPs -  Physician Assistants and Nurse Practitioners) who all work together to provide you with the care you need, when you need it.  Your next appointment:   4 week(s)  Provider:   Jari Favre, PA-C, Ronie Spies, PA-C, Robin Searing, NP, Jacolyn Reedy, PA-C, Eligha Bridegroom, NP, Tereso Newcomer, PA-C, or Perlie Gold, PA-C     Then, Elder Negus, MD will plan to see you again in 6 month(s).    Other Instructions   Your cardiac CT will be scheduled at one of the below locations:   [facility] [address] [place], [place] [address] [phone]  If scheduled at [facility], please arrive at the [facility] and Children's Entrance (Entrance C2) of [facility] 30 minutes prior to test start time. You can use the FREE valet parking offered at entrance C (encouraged to control the heart rate for the test)  Proceed to the [facility] Radiology Department (first floor) to check-in and test prep.  All radiology patients and guests should use entrance C2 at [facility], accessed from [facility], even though the hospital's physical address listed is [address].      Please follow these instructions carefully (unless otherwise directed):   On the Night Before the Test: Be sure to Drink plenty of water.  On the Day of the Test: Drink plenty of water until 1 hour prior to the test. Do not eat any food 1 hour prior to test. You  may take your regular medications prior to the test.    After the Test: Drink plenty of water. After receiving IV contrast, you may experience a mild flushed feeling. This is normal. On occasion, you may experience a mild rash up to 24 hours after the test. This is not dangerous. If this occurs, you can take Benadryl 25 mg, Zyrtec, Claritin, or Allegra and increase  your fluid intake. (Patients taking Tikosyn should avoid Benadryl, and may take Zyrtec, Claritin, or Allegra) If you experience trouble breathing, this can be serious. If it is severe call 911 IMMEDIATELY. If it is mild, please call our office.  We will call to schedule your test 2-4 weeks out understanding that some insurance companies will need an authorization prior to the service being performed.   For more information and frequently asked questions, please visit our website : [email or web address]  For non-scheduling related questions, please contact the cardiac imaging nurse navigator should you have any questions/concerns: Cardiac Imaging Nurse Navigators Direct Office Dial: [phone]   For scheduling needs, including cancellations and rescheduling, please call [place], [phone].    1st Floor: - Lobby - Registration  - Pharmacy  - Lab - Cafe  2nd Floor: - PV Lab - Diagnostic Testing (echo, CT, nuclear med)  3rd Floor: - Vacant  4th Floor: - TCTS (cardiothoracic surgery) - AFib Clinic - Structural Heart Clinic - Vascular Surgery  - Vascular Ultrasound  5th Floor: - HeartCare Cardiology (general and EP) - Clinical Pharmacy for coumadin, hypertension, lipid, weight-loss medications, and med management appointments    Valet parking services will be available as well.

## 2023-05-10 NOTE — Progress Notes (Signed)
 Cardiology Office Note:  .   Date:  05/10/2023  ID:  Matthew Cole, DOB Feb 26, 1961, MRN [id number] PCP: Matthew Joe, MD  [facility] Providers Cardiologist:  Truett Mainland, MD PCP: Matthew Joe, MD  Chief Complaint  Patient presents with   TAA      History of Present Illness: .    Matthew Cole is a 63 y.o. male with hypertension, hyperlipidemia, venous insufficiency, ascending aorta dilatation, h/o alcohol withdrawal seizure, family h/o aortic aneurysm w/rupture  Patient was hospitalized in 12/2022 w/ alcohol withdrawal seizure when he tried to quit drinking alcohol..  Currently, he is checking 2-3 glasses of wine or beers every day.  He worked with his primary care to slowly wean himself,.  At this close visit with me in 10/2022, I recommended CT angiogram aorta to evaluate for ascending aorta dilation.  It does not appear that he underwent this testing.  He stays active with normal walking and work without any complaint of chest pain or shortness of breath.  Blood pressure remains elevated.  Vitals:   05/10/23 1258  BP: (!) 170/98  Pulse: 82  SpO2: 95%     ROS:  Review of Systems  Cardiovascular:  Negative for chest pain, dyspnea on exertion, leg swelling, palpitations and syncope.     Studies Reviewed: Marland Kitchen        Independently interpreted 03/2023: Chol 237, TG 128, HDL 70, LDL 145  12/2022: Hb 13.9 Cr 0.94  09/14/2022: Glucose 84, BUN/Cr 16/0.89. EGFR 96. Na/K 138/4.5. Rest of the CMP normal H/H 13/40. MCV 102. Platelets 169 HbA1C NA Chol 247, TG 186, HDL 61, LDL 174 TSH 1.8 normal   03/2022: Chol 251, TG 153, HDL 69, LDL 154  Echocardiogram 10/08/2022: EF 66%. Trace MR, trace TR, mild AS, mild mitral and aortic valve calcification,  Moderately dilated ascending aorta    Physical Exam:   Physical Exam Vitals and nursing note reviewed.  Constitutional:      General: He is not in acute distress. Neck:     Vascular: No JVD.   Cardiovascular:     Rate and Rhythm: Normal rate and regular rhythm.     Heart sounds: Murmur heard.     High-pitched blowing holosystolic murmur is present with a grade of 2/6 at the apex.  Pulmonary:     Effort: Pulmonary effort is normal.     Breath sounds: Normal breath sounds. No wheezing or rales.  Musculoskeletal:     Right lower leg: Edema (Trace) present.     Left lower leg: Edema (Trace) present.      VISIT DIAGNOSES:   ICD-10-CM   1. Ascending aorta dilatation (HCC)  I77.810     2. Essential hypertension  I10     3. Mixed hyperlipidemia  E78.2        ASSESSMENT AND PLAN: .    Matthew Cole is a 63 y.o. male with hypertension, hyperlipidemia, venous insufficiency, ascending aorta dilatation, h/o alcohol withdrawal seizure, family h/o aortic aneurysm w/rupture   Ascending aorta dilatation: Incidental finding, details not available to me. Concern for familial aortopathy with family h/o aortic aneurysm rupture in his father.  Will obtain CTA aorta and echocardiogram Avoid weight lifting, Valsalva maneuvers, antibiotics like ciprofloxacin. After CTA aorta, he will likely need CVTS evaluation and genetic testing.  Recommend aggressive blood pressure control. Check CBC, BMP today.  Hypertension: Blood pressure remains controlled.  Continue amlodipine 10 mg daily, olmesartan 40 mg daily. In addition, started atenolol  50 mg daily.  If blood pressure remains >140/85 Maji, could increase atenolol to 100 mg daily.   Mixed hyperlipidemia: Currently on Crestor 10 mg daily, increased from 5 mg to 10 mg daily by PCP 1 month ago.. Check lipid panel.    No orders of the defined types were placed in this encounter.    F/u in 4 weeks w/APP for hypertension management. F/u in 6 months w/me  Signed, Elder Negus, MD

## 2023-05-28 ENCOUNTER — Ambulatory Visit (HOSPITAL_COMMUNITY): Attending: Cardiology

## 2023-05-28 DIAGNOSIS — R011 Cardiac murmur, unspecified: Secondary | ICD-10-CM | POA: Diagnosis not present

## 2023-05-28 DIAGNOSIS — I77810 Thoracic aortic ectasia: Secondary | ICD-10-CM | POA: Diagnosis present

## 2023-05-28 DIAGNOSIS — I10 Essential (primary) hypertension: Secondary | ICD-10-CM | POA: Insufficient documentation

## 2023-05-28 LAB — ECHOCARDIOGRAM COMPLETE
AR max vel: 1.45 cm2
AV Area VTI: 1.63 cm2
AV Area mean vel: 1.56 cm2
AV Mean grad: 10 mmHg
AV Peak grad: 20.6 mmHg
Ao pk vel: 2.27 m/s
Area-P 1/2: 3.68 cm2
S' Lateral: 3.7 cm

## 2023-06-01 NOTE — Progress Notes (Signed)
 Mild AS. Aorta not noted to be dilated on this study. I believe he has pending CTA. He is seeing you on 3/31.  Thanks MJP

## 2023-06-02 ENCOUNTER — Other Ambulatory Visit: Payer: Self-pay | Admitting: *Deleted

## 2023-06-02 MED ORDER — LORAZEPAM 1 MG PO TABS
ORAL_TABLET | ORAL | 0 refills | Status: DC
Start: 2023-06-02 — End: 2023-08-05

## 2023-06-04 ENCOUNTER — Ambulatory Visit (HOSPITAL_BASED_OUTPATIENT_CLINIC_OR_DEPARTMENT_OTHER)
Admission: RE | Admit: 2023-06-04 | Discharge: 2023-06-04 | Disposition: A | Discharge status: Home / Self Care | Source: Ambulatory Visit | Attending: Cardiology | Admitting: Cardiology

## 2023-06-04 DIAGNOSIS — I77810 Thoracic aortic ectasia: Secondary | ICD-10-CM | POA: Diagnosis present

## 2023-06-04 DIAGNOSIS — E782 Mixed hyperlipidemia: Secondary | ICD-10-CM | POA: Diagnosis present

## 2023-06-04 DIAGNOSIS — I10 Essential (primary) hypertension: Secondary | ICD-10-CM | POA: Insufficient documentation

## 2023-06-04 MED ORDER — IOHEXOL 350 MG/ML SOLN
100.0000 mL | Freq: Once | INTRAVENOUS | Status: AC | PRN
  Administered 2023-06-04: 78 mL via INTRAVENOUS

## 2023-06-06 NOTE — Progress Notes (Unsigned)
 Cardiology Office Note    Patient Name: Matthew Cole Date of Encounter: 06/07/2023  Primary Care Provider:  Tally Joe, MD Primary Cardiologist:  Elder Negus, MD Primary Electrophysiologist: None   Past Medical History    Past Medical History:  Diagnosis Date   Hypertension     History of Present Illness  Matthew Cole is a 63 y.o. male with a PMH of HTN, HLD, ascending aortic dilation with family history of rupture, EtOH abuse, venous insufficiency who presents today for 1 month follow-up.  [name] was seen initially by Dr. Enid Derry 11/06/2022 for management of ascending aortic dilation.  He underwent previous 2D echo on 10/08/2022 that showed trace MR with EF of 65% mild AAS with moderately dilated ascending aorta.  He was ordered a CTA of the aorta that was not completed.  He was hospitalized on 12/2022 with alcohol withdrawal and was last admitted on 12/24/2022 with complaint of dizziness and lightheadedness.  He was discharged after symptoms were found to be vasovagal.  He was last seen by Dr. Enid Derry on 05/10/2023 was noted to have elevated BP of 170/98.  He was also found to have holosystolic murmur on auscultation as well as trace lower extremity edema.  He underwent repeat 2D echo that showed EF of 55-60% with no RWMA and mildly dilated LA, moderate thickening of the AV with mild aortic stenosis and no dilation present.  He underwent gated CTA of the chest that is still pending completion.  Mr. Joos was seen in follow-up and reported doing well with no new cardiac complaints.  He was however noted to be hypertensive with initial blood pressure of 170/90 and recheck of 164/94.  He is compliant with his current medications and denies any adverse reactions.  He reports that his blood pressures have been elevated over the past 2 months.  He denies any indiscretions with salt in his diet.  During today's visit we discussed the importance of following a low-sodium diet and  also moderating his EtOH intake. Patient denies chest pain, palpitations, dyspnea, PND, orthopnea, nausea, vomiting, dizziness, syncope, edema, weight gain, or early satiety.  Discussed the use of AI scribe software for clinical note transcription with the patient, who gave verbal consent to proceed.  History of Present Illness   Review of Systems  Please see the history of present illness.    All other systems reviewed and are otherwise negative except as noted above.  Physical Exam    Wt Readings from Last 3 Encounters:  06/07/23 240 lb 9.6 oz (109.1 kg)  05/10/23 240 lb (108.9 kg)  02/03/23 242 lb 6.4 oz (110 kg)   VS: Vitals:   06/07/23 1353 06/07/23 1433  BP: (!) 170/90 (!) 164/94  Pulse: 60   SpO2: 95%   ,Body mass index is 28.53 kg/m. GEN: Well nourished, well developed in no acute distress Neck: No JVD; No carotid bruits Pulmonary: Clear to auscultation without rales, wheezing or rhonchi  Cardiovascular: Normal rate. Regular rhythm. Normal S1. Normal S2.   Murmurs: There is no murmur.  ABDOMEN: Soft, non-tender, non-distended EXTREMITIES:  No edema; No deformity   EKG/LABS/ Recent Cardiac Studies   ECG personally reviewed by me today -none completed today  Risk Assessment/Calculations:          Lab Results  Component Value Date   WBC 5.4 12/24/2022   HGB 13.9 12/24/2022   HCT 41.0 12/24/2022   MCV 103.8 (H) 12/24/2022   PLT 113 (L) 12/24/2022  Lab Results  Component Value Date   CREATININE 0.94 12/24/2022   BUN 24 (H) 12/24/2022   NA 137 12/24/2022   K 3.9 12/24/2022   CL 101 12/24/2022   CO2 24 12/24/2022   No results found for: "CHOL", "HDL", "LDLCALC", "LDLDIRECT", "TRIG", "CHOLHDL"  No results found for: "HGBA1C" Assessment & Plan    1.  Aortic dilation: -2D echo completed on 06/01/2023 showing mild AS with no dilation noted and CTA of the chest completed but has not yet resulted -Patient advised to defer heavy lifting and avoid  fluoroquinolone antibiotics -Blood pressure today was not at goal and importance of tight blood pressure control encouraged and advised with patient.  2.  Essential hypertension: -HYPERTENSION CONTROL Vitals:   06/07/23 1353 06/07/23 1433  BP: (!) 170/90 (!) 164/94    The patient's blood pressure is elevated above target today.  In order to address the patient's elevated BP: A current anti-hypertensive medication was adjusted today.; Blood pressure will be monitored at home to determine if medication changes need to be made.   -Discontinue atenolol 50 mg and start carvedilol 3.25 mg twice daily -Continue Benicar 40 mg and Norvasc 10 mg -Patient will monitor blood pressures over the next 2 weeks and call with results -If blood pressure remains elevated patient will be referred to HTN clinic  3.  Hyperlipidemia: -Patient's last LDL cholesterol was 145 -Continue Crestor 10 mg daily  4.  EtOH : -Patient reports ongoing alcohol use and notes 2 glasses of wine daily with his neighbor. -Patient was encouraged to reduce intake to offset elevation in BP.  5.  History of venous insufficiency: -Continue current treatment plan per [facility] -Patient is scheduled to have procedure on left ankle ulcer   Disposition: Follow-up with Elder Negus, MD or APP in 3 months    Signed, Napoleon Form, Leodis Rains, NP 06/07/2023, 5:40 PM [facility]

## 2023-06-07 ENCOUNTER — Ambulatory Visit: Attending: Nurse Practitioner | Admitting: Nurse Practitioner

## 2023-06-07 ENCOUNTER — Encounter: Payer: Self-pay | Admitting: Nurse Practitioner

## 2023-06-07 VITALS — BP 164/94 | HR 60 | Ht 77.0 in | Wt 240.6 lb

## 2023-06-07 DIAGNOSIS — I77810 Thoracic aortic ectasia: Secondary | ICD-10-CM

## 2023-06-07 DIAGNOSIS — E782 Mixed hyperlipidemia: Secondary | ICD-10-CM | POA: Diagnosis not present

## 2023-06-07 DIAGNOSIS — L97329 Non-pressure chronic ulcer of left ankle with unspecified severity: Secondary | ICD-10-CM

## 2023-06-07 DIAGNOSIS — I10 Essential (primary) hypertension: Secondary | ICD-10-CM | POA: Diagnosis not present

## 2023-06-07 DIAGNOSIS — F1010 Alcohol abuse, uncomplicated: Secondary | ICD-10-CM

## 2023-06-07 DIAGNOSIS — I872 Venous insufficiency (chronic) (peripheral): Secondary | ICD-10-CM

## 2023-06-07 MED ORDER — CARVEDILOL 3.125 MG PO TABS
3.1250 mg | ORAL_TABLET | Freq: Two times a day (BID) | ORAL | 1 refills | Status: DC
Start: 2023-06-07 — End: 2023-07-13

## 2023-06-07 NOTE — Patient Instructions (Signed)
 Medication Instructions:  STOP Atenolol  START Coreg 3.125mg  Take 1 tablet twice day  *If you need a refill on your cardiac medications before your next appointment, please call your pharmacy*  Lab Work: None ordered  If you have labs (blood work) drawn today and your tests are completely normal, you will receive your results only by: MyChart Message (if you have MyChart) OR A paper copy in the mail If you have any lab test that is abnormal or we need to change your treatment, we will call you to review the results.  Testing/Procedures: None ordered  Follow-Up: At [facility], you and your health needs are our priority.  As part of our continuing mission to provide you with exceptional heart care, our providers are all part of one team.  This team includes your primary Cardiologist (physician) and Advanced Practice Providers or APPs (Physician Assistants and Nurse Practitioners) who all work together to provide you with the care you need, when you need it.  Your next appointment:   3 month(s)  Provider:   Robin Searing, NP        We recommend signing up for the patient portal called "MyChart".  Sign up information is provided on this After Visit Summary.  MyChart is used to connect with patients for Virtual Visits (Telemedicine).  Patients are able to view lab/test results, encounter notes, upcoming appointments, etc.  Non-urgent messages can be sent to your provider as well.   To learn more about what you can do with MyChart, go to [email or web address].   Other Instructions Check your blood pressure daily for 2 weeks, then contact the office with your readings.  Contact the office either by phone or MyChart with your readings.  Make sure to check your blood pressure 2 hours after taking your medications.   AVOID these things for 30 minutes before checking your blood pressure: No Drinking caffeine. No Drinking alcohol. No Eating. No Smoking. No Exercising.  Five  minutes before checking your blood pressure: Pee. Sit in a dining chair. Avoid sitting in a soft couch or armchair. Be quiet. Do not talk.       1st Floor: - Lobby - Registration  - Pharmacy  - Lab - Cafe  2nd Floor: - PV Lab - Diagnostic Testing (echo, CT, nuclear med)  3rd Floor: - Vacant  4th Floor: - TCTS (cardiothoracic surgery) - AFib Clinic - Structural Heart Clinic - Vascular Surgery  - Vascular Ultrasound  5th Floor: - HeartCare Cardiology (general and EP) - Clinical Pharmacy for coumadin, hypertension, lipid, weight-loss medications, and med management appointments    Valet parking services will be available as well.

## 2023-06-08 ENCOUNTER — Telehealth: Payer: Self-pay

## 2023-06-08 NOTE — Telephone Encounter (Signed)
 Pt called to cancel his laser ablation/stab phlebectomies for this week. He feels he is seeing improvement in his ulcer and is wanting to re-evaluate things in a month or so. He is aware this would not be something that we can immediately r/s in the future, as insurance would need to get pre authorized again.

## 2023-06-10 ENCOUNTER — Other Ambulatory Visit: Payer: No Typology Code available for payment source | Admitting: Vascular Surgery

## 2023-06-14 ENCOUNTER — Other Ambulatory Visit: Payer: Self-pay

## 2023-06-14 ENCOUNTER — Telehealth: Payer: Self-pay | Admitting: Cardiology

## 2023-06-14 DIAGNOSIS — I7121 Aneurysm of the ascending aorta, without rupture: Secondary | ICD-10-CM

## 2023-06-14 NOTE — Telephone Encounter (Signed)
 Patient stated he is returning a phone call to RN Nehemiah Settle, currently do not see any notes of RN Nehemiah Settle calling. Please advise.

## 2023-06-14 NOTE — Progress Notes (Signed)
 Acending aorta 4.2 cm.  This is far from surgical size, but given concern for familial aortopathy, I would recommend referral to establish with CVTS, as well as genetic counseling.  Thanks MJP

## 2023-06-14 NOTE — Telephone Encounter (Signed)
 Review lab result note.

## 2023-06-30 ENCOUNTER — Encounter: Payer: No Typology Code available for payment source | Admitting: Vascular Surgery

## 2023-06-30 ENCOUNTER — Encounter (HOSPITAL_COMMUNITY): Payer: No Typology Code available for payment source

## 2023-07-01 NOTE — Progress Notes (Signed)
 [address]       [name] [address]             [phone]    [name] [id number] 03-16-1960  History of Present Illness:  Matthew Cole is a 63 y.o. male with hypertension, hyperlipidemia, venous insufficiency, ascending aorta dilatation, h/o alcohol withdrawal seizure, and family h/o aortic aneurysm w/rupture.  The patient was advised by Dr. Luke Salaam to undergo CTA of chest to assess for possible aortic aneurysm due to family history.  This showed a small aortic aneurysm measuring 4.2 cm without evidence of dissection/rupture.  He presents today to establish surveillance for Ascending Aneurysm.  Overall patient is without complaints.  He admits to continued drinking of 2-3 drinks per day.  He is a lifelong non-smoker.  He states his dad was about 74 when his rupture occurred.  He has never underwent testing for Marfan Syndrome.  Current Outpatient Medications on File Prior to Visit  Medication Sig Dispense Refill   amLODipine  (NORVASC ) 10 MG tablet Take 10 mg by mouth daily.     carvedilol  (COREG ) 3.125 MG tablet TAKE 1 TABLET BY MOUTH 2 TIMES DAILY. 180 tablet 2   folic acid  (FOLVITE ) 1 MG tablet Take 1 tablet (1 mg total) by mouth daily.     LORazepam  (ATIVAN ) 1 MG tablet Take 1 tablet 30 to 60 minutes prior to leaving house on day of office surgery.  Bring second tablet with you to office on day of office surgery. 2 tablet 0   Multiple Vitamin (MULTIVITAMIN WITH MINERALS) TABS tablet Take 1 tablet by mouth daily.     olmesartan (BENICAR) 40 MG tablet Take 40 mg by mouth daily.     rosuvastatin  (CRESTOR ) 10 MG tablet Take 10 mg by mouth daily.     thiamine  (VITAMIN B-1) 100 MG tablet Take 1 tablet (100 mg total) by mouth daily.     traZODone  (DESYREL ) 150 MG tablet Take 150 mg by mouth at bedtime.     No current facility-administered medications on file prior to visit.   BP (!) 170/90   Pulse 100   Resp 20   Ht 6\' 5"  (1.956 m)   Wt 240 lb (108.9  kg)   SpO2 95% Comment: RA  BMI 28.46 kg/m   Physical Exam  Gen: NAD, very tall 6'5 Heart: RRR, + mild systolic murmur Lungs: CTA Ext: no edema Neuro:grossly intact  CTA Results:  FINDINGS: Cardiovascular: There is mild-to-moderate patchy calcific plaque of the thoracic aorta and great vessels without dissection or stenosis.   There are scattered calcifications and thickening of the aortic valve leaflets. Consider echocardiographic assessment of valvular function.   The thoracic aorta is tortuous. Representative measurements as follows:   Valve plane: 2.7 cm on 601:119;   Sinuses: 4.4 cm on 601:119;   Ascending aorta: Fusiform dilatation up to 4.1 cm on 601:124 and 4.2 cm on 602:87;   Mid arch: 3.1 x 3.3 cm on 601:114 and 301:39;   Isthmus: 3.2 cm on 602:100;   Proximal descending segment: 2.8 cm on 602:94;   Distal descending segment: 2.7 cm on 602:96;   Hiatal segment: 2.6 cm on 602:97.   The pulmonary arteries and veins are normal in caliber. There is mild panchamber cardiomegaly.   There are no appreciable coronary calcifications, no pericardial effusion.   Mediastinum/Nodes: No enlarged mediastinal, hilar, or axillary lymph nodes. Thyroid gland, trachea, and esophagus demonstrate no significant findings.There is a small  hiatal hernia.   Lungs/Pleura: The posterior extreme bases were excluded from the study. There is pleural-parenchymal scarring in both lung apices.   There is no consolidation, effusion or visible nodule. Few linear scar-like opacities noted both lung bases. Central airways are patent.   Upper Abdomen: Abdominal aortic atherosclerosis. No acute upper abdominal findings. Mild-to-moderate hepatic steatosis.   Musculoskeletal: Mild osteopenia with degenerative changes and mild kyphosis of the thoracic spine.   No acute or significant osseous findings.  Unremarkable chest wall.   Review of the MIP images confirms the above  findings.   IMPRESSION: 1. Aortic atherosclerosis and tortuosity. 2. Fusiform dilatation of ascending aorta up to 4.2 cm. Recommend annual imaging followup by CTA or MRA. This recommendation follows 2010 ACCF/AHA/AATS/ACR/ASA/SCA/SCAI/ SIR/STS/SVM Guidelines for the Diagnosis and Management of Patients with Thoracic Aortic Disease. Circulation. 2010; 121: J811-B147. Aortic aneurysm NOS (ICD10-I71.9). 3. Aortic valve leaflet calcifications and thickening. Consider echocardiographic assessment of valvular function. 4. Mild panchamber cardiomegaly.  No visible coronary calcification. 5. Small hiatal hernia. 6. Hepatic steatosis. 7. Osteopenia and degenerative change.     Electronically Signed   By: Denman Fischer M.D.   On: 06/14/2023 00:28    A/P:  Ascending Aortic Aneurysm- measuring small at 4.1, 4.2- this will require yearly surveillance Mild Aortic Stenosis- mild murmur on exam H/O Alcohol abuse-continues to drink 2 glasses of wine daily HLD- on Crestor  H/O HTN- slightly elevated on previous readings, medications recently adjusted by Charles Connor NP... elevated today, however documentation he brought to the appointment, shows mostly good control at recent readings Venous Insufficiency per Vascular surgery  The patient has family history of aortic rupture.  Of note he is very tall at 6'5 and does have some characteristics of Marfan Syndrome.  I discussed having genetic testing done which he was agreeable to, however he preferred to have this done by his primary care physician if able.  RTC in 1 year with repeat CTA chest  Risk Modification:  Statin:  yes  Patient was counseled on importance of Blood Pressure Control.  Despite Medical intervention if the patient notices persistently elevated blood pressure readings.  They are instructed to contact their Primary Care Physician  Please avoid use of Fluoroquinolones as this can potentially increase your risk of Aortic Rupture  and/or Dissection  Patient educated on signs and symptoms of Aortic Dissection, handout also provided in AVS  Kriste Broman, PA-C 07/15/23

## 2023-07-01 NOTE — Patient Instructions (Signed)
 Patient is counseled regarding the importance of long term risk factor modification as they pertain to the presence of ischemic heart disease including avoiding the use of all tobacco products, dietary modifications and medical therapy for diabetes, cholesterol and lipid management, and regular exercise.     Make every effort to maintain a "heart-healthy" lifestyle with regular physical exercise and adherence to a low-fat, low-carbohydrate diet.  Continue to seek regular follow-up appointments with your primary care physician and/or cardiologist.  AVOID FLOUROQUINOLONES (ex: Cipro)- as this class of drug can increase your risk of Aortic Dissection

## 2023-07-06 ENCOUNTER — Ambulatory Visit: Admitting: Cardiology

## 2023-07-11 ENCOUNTER — Other Ambulatory Visit: Payer: Self-pay | Admitting: Nurse Practitioner

## 2023-07-15 ENCOUNTER — Ambulatory Visit: Attending: Surgery | Admitting: Physician Assistant

## 2023-07-15 VITALS — BP 170/90 | HR 100 | Resp 20 | Ht 77.0 in | Wt 240.0 lb

## 2023-07-15 DIAGNOSIS — I77810 Thoracic aortic ectasia: Secondary | ICD-10-CM

## 2023-07-21 ENCOUNTER — Other Ambulatory Visit: Payer: Self-pay | Admitting: *Deleted

## 2023-07-21 DIAGNOSIS — L97329 Non-pressure chronic ulcer of left ankle with unspecified severity: Secondary | ICD-10-CM

## 2023-07-26 ENCOUNTER — Telehealth: Payer: Self-pay

## 2023-07-26 ENCOUNTER — Ambulatory Visit: Attending: Surgery | Admitting: Physician Assistant

## 2023-07-26 ENCOUNTER — Encounter: Payer: Self-pay | Admitting: Physician Assistant

## 2023-07-26 ENCOUNTER — Other Ambulatory Visit (HOSPITAL_COMMUNITY): Payer: Self-pay

## 2023-07-26 VITALS — BP 152/85 | HR 88 | Temp 98.5°F

## 2023-07-26 DIAGNOSIS — L97909 Non-pressure chronic ulcer of unspecified part of unspecified lower leg with unspecified severity: Secondary | ICD-10-CM | POA: Diagnosis not present

## 2023-07-26 DIAGNOSIS — I83023 Varicose veins of left lower extremity with ulcer of ankle: Secondary | ICD-10-CM

## 2023-07-26 DIAGNOSIS — I83009 Varicose veins of unspecified lower extremity with ulcer of unspecified site: Secondary | ICD-10-CM | POA: Diagnosis not present

## 2023-07-26 DIAGNOSIS — L97329 Non-pressure chronic ulcer of left ankle with unspecified severity: Secondary | ICD-10-CM

## 2023-07-26 MED ORDER — CEPHALEXIN 500 MG PO CAPS
500.0000 mg | ORAL_CAPSULE | Freq: Three times a day (TID) | ORAL | 0 refills | Status: DC
  Filled 2023-07-26: qty 42, 14d supply, fill #0

## 2023-07-26 NOTE — Progress Notes (Signed)
 Office Note     CC:  follow up Requesting Provider:  Rae Bugler, MD  HPI: Matthew Cole is a 63 y.o. (April 23, 1960) male who comes to clinic as an urgent triage visit for evaluation of worsening wounds of left lower leg.  He has been seen in this office for evaluation of venous insufficiency and had been in weekly Unna boots in the past.  He is scheduled for laser ablation therapy with Dr. Vikki Graves next week.  He has been applying Voltaren gel to open venous wounds of left lower leg as well as soaking and anti-itch baths.  He denies any fevers, chills, nausea/vomiting.  He has not been wearing compression due to the pain associated with compression.   Past Medical History:  Diagnosis Date   Hypertension     History reviewed. No pertinent surgical history.  Social History   Socioeconomic History   Marital status: Married    Spouse name: Not on file   Number of children: 1   Years of education: Not on file   Highest education level: Not on file  Occupational History   Not on file  Tobacco Use   Smoking status: Never   Smokeless tobacco: Never  Vaping Use   Vaping status: Never Used  Substance and Sexual Activity   Alcohol use: Yes    Comment: 1 bottle of wine and a strong liquior drink every night     Drug use: Never   Sexual activity: Not on file  Other Topics Concern   Not on file  Social History Narrative   Not on file   Social Drivers of Health   Financial Resource Strain: Not on file  Food Insecurity: No Food Insecurity (12/20/2022)   Hunger Vital Sign    Worried About Running Out of Food in the Last Year: Never true    Ran Out of Food in the Last Year: Never true  Transportation Needs: No Transportation Needs (12/20/2022)   PRAPARE - Administrator, Civil Service (Medical): No    Lack of Transportation (Non-Medical): No  Physical Activity: Not on file  Stress: Not on file  Social Connections: Unknown (07/22/2021)   [facility],  [facility]   Social Network    Social Network: Not on file  Intimate Partner Violence: Not At Risk (12/20/2022)   Humiliation, Afraid, Rape, and Kick questionnaire    Fear of Current or Ex-Partner: No    Emotionally Abused: No    Physically Abused: No    Sexually Abused: No    Family History  Problem Relation Age of Onset   Cancer Mother    Cancer Father     Current Outpatient Medications  Medication Sig Dispense Refill   amLODipine  (NORVASC ) 10 MG tablet Take 10 mg by mouth daily.     carvedilol  (COREG ) 3.125 MG tablet TAKE 1 TABLET BY MOUTH 2 TIMES DAILY. 180 tablet 2   folic acid  (FOLVITE ) 1 MG tablet Take 1 tablet (1 mg total) by mouth daily.     LORazepam  (ATIVAN ) 1 MG tablet Take 1 tablet 30 to 60 minutes prior to leaving house on day of office surgery.  Bring second tablet with you to office on day of office surgery. 2 tablet 0   Multiple Vitamin (MULTIVITAMIN WITH MINERALS) TABS tablet Take 1 tablet by mouth daily.     olmesartan (BENICAR) 40 MG tablet Take 40 mg by mouth daily.     rosuvastatin  (CRESTOR ) 10 MG tablet Take 10  mg by mouth daily.     thiamine  (VITAMIN B-1) 100 MG tablet Take 1 tablet (100 mg total) by mouth daily.     traZODone  (DESYREL ) 150 MG tablet Take 150 mg by mouth at bedtime.     No current facility-administered medications for this visit.    Allergies  Allergen Reactions   Ciprofloxacin Other (See Comments)    Ascending Aortic Aneurysm- increases risk of Aortic Dissection     REVIEW OF SYSTEMS:   [X]  denotes positive finding, [ ]  denotes negative finding Cardiac  Comments:  Chest pain or chest pressure:    Shortness of breath upon exertion:    Short of breath when lying flat:    Irregular heart rhythm:        Vascular    Pain in calf, thigh, or hip brought on by ambulation:    Pain in feet at night that wakes you up from your sleep:     Blood clot in your veins:    Leg swelling:         Pulmonary    Oxygen at home:     Productive cough:     Wheezing:         Neurologic    Sudden weakness in arms or legs:     Sudden numbness in arms or legs:     Sudden onset of difficulty speaking or slurred speech:    Temporary loss of vision in one eye:     Problems with dizziness:         Gastrointestinal    Blood in stool:     Vomited blood:         Genitourinary    Burning when urinating:     Blood in urine:        Psychiatric    Major depression:         Hematologic    Bleeding problems:    Problems with blood clotting too easily:        Skin    Rashes or ulcers:        Constitutional    Fever or chills:      PHYSICAL EXAMINATION:  Vitals:   07/26/23 1130  BP: (!) 152/85  Pulse: 88  Temp: 98.5 F (36.9 C)  TempSrc: Temporal  SpO2: 98%    General:  WDWN in NAD; vital signs documented above Gait: Not observed HENT: WNL, normocephalic Pulmonary: normal non-labored breathing , without Rales, rhonchi,  wheezing Cardiac: regular HR Abdomen: soft, NT, no masses Skin: without rashes Vascular Exam/Pulses: palpable L DP pulse Extremities: Redness of the left lower leg below the knee with open wounds circumferentially around the ankle and mid shin pictured below Musculoskeletal: no muscle wasting or atrophy  Neurologic: A&O X 3 Psychiatric:  The pt has Normal affect.    ASSESSMENT/PLAN:: 63 y.o. male here as an urgent triage add-on for evaluation of worsening wounds of left lower leg  Matthew Cole is a 63 year old male being treated in our clinic for venous insufficiency and was scheduled for left GSV ablation with Dr. Vikki Graves next week.  He has developed new venous wounds and has been applying Voltaren gel to the wounds.  He has also been soaking his leg in anti-itch baths.  On exam he appears to have burned the skin on his leg.  He also has generalized redness suggesting cellulitis.  Based on the appearance and extent of the tissue loss I recommended admission to the hospital for IV antibiotics  and  wound care.  He would prefer to try outpatient treatment first.  He will be prescribed 2 weeks of Keflex .  He will be wrapped with Xeroform gauze, Kerlix and Ace.  I have asked him to elevate his legs periodically each and every day and avoid prolonged sitting and standing.  If the appearance of his leg does not improve over the next 24 to 48 hours I encouraged him to present to the emergency department.   Cordie Deters, PA-C [facility] [phone]  Clinic MD:   Charlotte Cookey

## 2023-07-26 NOTE — Telephone Encounter (Signed)
 Pt called c/o of increase pain to venous ulcer on lower left leg.  Pt has scheduled ablation 08/05/23.   Pt reports he soaked his leg in an oatmeal bath, put voltaren gel and aquaphor on his wound.   Pt reports increased pain, swelling and weeping, no odor. Triage appt with PA made for 07/26/23 @ 11:15.

## 2023-07-27 ENCOUNTER — Telehealth: Payer: Self-pay

## 2023-07-28 NOTE — Telephone Encounter (Signed)
 07/27/2023 @ 0900  Called pt to follow up on the condition of his left leg.   Pt reported decreased pain and he had returned to work. Pt reported that he was taking his ABX.  Nurse reinforced to take it as prescribed, 3x/day, every 8 hours, for complete course. Pt reported he was taking Tylenol  and Ibuprofen  to manage his pain, alternating every 6 hours.

## 2023-08-05 ENCOUNTER — Telehealth: Payer: Self-pay

## 2023-08-05 ENCOUNTER — Encounter: Payer: Self-pay | Admitting: Vascular Surgery

## 2023-08-05 ENCOUNTER — Ambulatory Visit: Attending: Vascular Surgery | Admitting: Vascular Surgery

## 2023-08-05 VITALS — BP 155/91 | HR 87 | Temp 97.8°F | Resp 20 | Ht 77.0 in | Wt 231.0 lb

## 2023-08-05 DIAGNOSIS — I83023 Varicose veins of left lower extremity with ulcer of ankle: Secondary | ICD-10-CM

## 2023-08-05 DIAGNOSIS — L97329 Non-pressure chronic ulcer of left ankle with unspecified severity: Secondary | ICD-10-CM

## 2023-08-05 HISTORY — PX: LASER ABLATION: SHX1947

## 2023-08-05 NOTE — Telephone Encounter (Signed)
 I called and lm on vm to advise can make an appt for eval on Monday at 12:45. To cb and confirm if he can make it that day. Will hold message and try again later.

## 2023-08-05 NOTE — Progress Notes (Signed)
 Patient name: Matthew Cole MRN: [id number] DOB: [birth year] Sex: male  REASON FOR VISIT: Treatment of left lower extremity venous ulceration  HPI: Matthew Cole is a 63 y.o. male with history of left lower extremity venous ulceration.  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  Wounds are now improving with Keflex .  He has been compliant with compression stockings.  Current Outpatient Medications  Medication Sig Dispense Refill   amLODipine  (NORVASC ) 10 MG tablet Take 10 mg by mouth daily.     carvedilol  (COREG ) 3.125 MG tablet TAKE 1 TABLET BY MOUTH 2 TIMES DAILY. 180 tablet 2   cephALEXin  (KEFLEX ) 500 MG capsule Take 1 capsule (500 mg total) by mouth 3 (three) times daily. 42 capsule 0   folic acid  (FOLVITE ) 1 MG tablet Take 1 tablet (1 mg total) by mouth daily.     LORazepam  (ATIVAN ) 1 MG tablet Take 1 tablet 30 to 60 minutes prior to leaving house on day of office surgery.  Bring second tablet with you to office on day of office surgery. 2 tablet 0   Multiple Vitamin (MULTIVITAMIN WITH MINERALS) TABS tablet Take 1 tablet by mouth daily.     olmesartan (BENICAR) 40 MG tablet Take 40 mg by mouth daily.     rosuvastatin  (CRESTOR ) 10 MG tablet Take 10 mg by mouth daily.     thiamine  (VITAMIN B-1) 100 MG tablet Take 1 tablet (100 mg total) by mouth daily.     traZODone  (DESYREL ) 150 MG tablet Take 150 mg by mouth at bedtime.     No current facility-administered medications for this visit.    PHYSICAL EXAM: Vitals:   08/05/23 0905  BP: (!) 155/91  Pulse: 87  Resp: 20  Temp: 97.8 F (36.6 C)  SpO2: 97%   He is awake alert and oriented Nonlabored respirations Left leg varicosities on the posterior and medial calf marked with the patient standing   PROCEDURE: Left small saphenous vein ablation totaling 14 cm and stab phlebectomy of posterior and medial leg between 10 and 20  sites  TECHNIQUE: Mr. Denault was taken to the procedure room where he was initially placed standing and the varicosities were marked of the posterior medial leg.  He was then laid prone on the procedural table and sterilely prepped and draped in the left lower extremity.  Ultrasound was used to identify a very large small saphenous vein and this was traced up to the popliteal fossa where it was noted to be quite varicose and then enter into the popliteal vein.  There was unfortunately a large Baker's cyst which was also occluding the view.  I anesthetized the skin over the cyst placed an 18-gauge needle into this and drained approximately 20 cc which improved our field-of-view.  I then cannulated the small saphenous vein with a micropuncture needle followed by wire and a sheath.  I placed a J-wire up just prior to where the small saphenous vein dove deep to the popliteal vein.  I then placed the laser sheath up to this level as well and the laser was placed for a total of 14 cm.  Tumescent anesthesia was instilled along the length of the laser catheter.  All portions of the room were then prepped with glasses the patient was placed in Trendelenburg position and the vein was ablated for a total of 14 cm and he tolerated this well.  The popliteal vein was noted to be patent and compressible at completion.  We then turned our attention towards the varicosities and the areas were anesthetized 1% lidocaine and tumescent anesthesia was instilled we made between 10 and 20 small stab phlebectomy incisions with 11 blade and remove the varicosities with a combination of hemostat and crochet hook.  At completion we placed Steri-Strips over the incision sites and an Unna boot was placed over the left lower extremity.  Plan will be for follow-up here in 2 weeks with post ablation duplex to rule out DVT.  We will refer him to Ortho care for evaluation of treatment for venous wound.  Angela Kell [facility] [phone]

## 2023-08-05 NOTE — Telephone Encounter (Signed)
 Call pt to make an appt for Monday. Possible THOR enrollment. S?P SSV ablation with vascular and will have to wait 30 days before to activate but can screen and obtain historical data required by study in that time while providing wound care.

## 2023-08-05 NOTE — Progress Notes (Addendum)
     Laser Ablation Procedure    Date: 08/05/2023 Matthew Cole DOB:Aug 20, 1960 Consent signed: Yes     Surgeon: Dr. Sharanya Templin Kell Procedure: Laser Ablation: left Small Saphenous Vein BP (!) 155/91 (BP Location: Left Arm, Patient Position: Sitting, Cuff Size: Normal)   Pulse 87   Temp 97.8 F (36.6 C) (Temporal)   Resp 20   Ht 6\' 5"  (1.956 m)   Wt 231 lb (104.8 kg)   SpO2 97%   BMI 27.39 kg/m   Tumescent Anesthesia: 300 cc 0.9% NaCl with 50 cc Lidocaine HCL 1%  and 15 cc 8.4% NaHCO3 Local Anesthesia: 4 cc Lidocaine HCL and NaHCO3 (ratio 2:1) 7 watts continuous mode    Total energy: 100 seconds    Total time: 706.3 joules Treatment Length 14 cm Laser Fiber Ref. #   [id number]        Lot # [id number]  Stab Phlebectomy: 10-20 Sites: Calf Patient tolerated procedure well Notes: . All staff members wore facial masks. Pt had 1 mg ativan  at 07:00 am and another at 08:20 am prior to surgical procedure. Making a total of 2 mg.   Description of Procedure: After marking the course of the secondary varicosities, the patient was placed on the operating table in the prone position, and the left leg was prepped and draped in sterile fashion.   Local anesthetic was administered and under ultrasound guidance the saphenous vein was accessed with a micro needle and guide wire; then the mirco puncture sheath was placed.  A guide wire was inserted saphenopopliteal junction , followed by a 5 french sheath.  The position of the sheath and then the laser fiber below the junction was confirmed using the ultrasound.  Tumescent anesthesia was administered along the course of the saphenous vein using ultrasound guidance. The patient was placed in Trendelenburg position and protective laser glasses were placed on patient and staff, and the laser was fired at 7 watts continuous mode for a total of 706.3 joules.  For stab phlebectomies, local anesthetic was administered at the previously marked varicosities, and  tumescent anesthesia was administered around the vessels.  Ten to 20 stab wounds were made using the tip of an 11 blade. And using the vein hook, the phlebectomies were performed using a hemostat to avulse the varicosities.  Adequate hemostasis was achieved.   Steri strips were applied to the stab wounds and ABD pads and unna boot was applied in calf. Ace wrap bandages were applied over the phlebectomy sites and at the top of the saphenopopliteal junction. Blood loss was less than 15 cc.  Discharge instructions reviewed with patient and hardcopy of discharge instructions given to patient to take home. The patient was wheeled out of the operating room having tolerated the procedure well.

## 2023-08-06 NOTE — Telephone Encounter (Signed)
 Spoke with pt appt sch for Tuesday

## 2023-08-10 ENCOUNTER — Ambulatory Visit: Admitting: Orthopedic Surgery

## 2023-08-10 DIAGNOSIS — I87332 Chronic venous hypertension (idiopathic) with ulcer and inflammation of left lower extremity: Secondary | ICD-10-CM | POA: Diagnosis not present

## 2023-08-15 ENCOUNTER — Encounter: Payer: Self-pay | Admitting: Orthopedic Surgery

## 2023-08-15 NOTE — Progress Notes (Signed)
 Office Visit Note   Patient: Matthew Cole           Date of Birth: [birth year]           MRN: [id number] Visit Date: 08/10/2023              Requested by: Rae Bugler, MD [phone] [address] [place],  [place] [address] PCP: Rae Bugler, MD  Chief Complaint  Patient presents with   Left Leg - Wound Check      HPI: Patient is a 63 year old gentleman who is seen for initial evaluation for venous leg ulcer left leg.  Patient is status post venous ablation on May 30.  Assessment & Plan: Visit Diagnoses:  1. Chronic venous hypertension (idiopathic) with ulcer and inflammation of left lower extremity (HCC)     Plan: Will apply a Dynaflex compression wrap with silver cell.  Continue with serial compression wraps.  Will evaluate for enrollment in the study in 30 days after surgery.  Follow-Up Instructions: Return in about 1 week (around 08/17/2023).   Ortho Exam  Patient is alert, oriented, no adenopathy, well-dressed, normal affect, normal respiratory effort. Examination patient has heavy thick brown drainage.  Patient states he was using Voltaren gel.  Patient has a strong palpable dorsalis pedis pulse.  Patient has 1 chronic ulcer that measures 2 cm in diameter with multiple other satellite lesions.    Imaging: No results found.      Labs: No results found for: "HGBA1C", "ESRSEDRATE", "CRP", "LABURIC", "REPTSTATUS", "GRAMSTAIN", "CULT", "LABORGA"   Lab Results  Component Value Date   ALBUMIN 4.0 12/24/2022   ALBUMIN 3.9 12/22/2022   ALBUMIN 3.9 12/21/2022    Lab Results  Component Value Date   MG 1.8 12/21/2022   No results found for: "VD25OH"  No results found for: "PREALBUMIN"    Latest Ref Rng & Units 12/24/2022    8:08 AM 12/22/2022    5:10 AM 12/21/2022    4:39 AM  CBC EXTENDED  WBC 4.0 - 10.5 K/uL 5.4  4.1  3.9   RBC 4.22 - 5.81 MIL/uL 3.95  4.16  3.91   Hemoglobin 13.0 - 17.0 g/dL 78.2  95.6  21.3   HCT 39.0 - 52.0 % 41.0  42.0   40.0   Platelets 150 - 400 K/uL 113  125  113   NEUT# 1.7 - 7.7 K/uL 4.2     Lymph# 0.7 - 4.0 K/uL 0.6        There is no height or weight on file to calculate BMI.  Orders:  No orders of the defined types were placed in this encounter.  No orders of the defined types were placed in this encounter.    Procedures: No procedures performed  Clinical Data: No additional findings.  ROS:  All other systems negative, except as noted in the HPI. Review of Systems  Objective: Vital Signs: There were no vitals taken for this visit.  Specialty Comments:  No specialty comments available.  PMFS History: Patient Active Problem List   Diagnosis Date Noted   Alcohol withdrawal seizure (HCC) 12/20/2022   Venous stasis ulcer of ankle (HCC) 12/20/2022   Alcoholic ketoacidosis 12/20/2022   Ascending aorta dilatation (HCC) 11/06/2022   Mixed hyperlipidemia 11/06/2022   Essential hypertension 11/06/2022   Past Medical History:  Diagnosis Date   Hypertension     Family History  Problem Relation Age of Onset   Cancer Mother    Cancer Father  Past Surgical History:  Procedure Laterality Date   LASER ABLATION Left 08/05/2023   Laser ablation of the left small saphenous vein with 10-20 stab phlebectomies   Social History   Occupational History   Not on file  Tobacco Use   Smoking status: Never   Smokeless tobacco: Never  Vaping Use   Vaping status: Never Used  Substance and Sexual Activity   Alcohol use: Yes    Comment: 1 bottle of wine and a strong liquior drink every night     Drug use: Never   Sexual activity: Not on file

## 2023-08-17 ENCOUNTER — Telehealth: Payer: Self-pay | Admitting: Orthopedic Surgery

## 2023-08-17 ENCOUNTER — Ambulatory Visit (INDEPENDENT_AMBULATORY_CARE_PROVIDER_SITE_OTHER)

## 2023-08-17 DIAGNOSIS — I87332 Chronic venous hypertension (idiopathic) with ulcer and inflammation of left lower extremity: Secondary | ICD-10-CM | POA: Diagnosis not present

## 2023-08-17 NOTE — Telephone Encounter (Signed)
 SW pt, he is coming back into the office this afternoon for a rewrap of the compression. I did apologize to him for any inconvenience.

## 2023-08-17 NOTE — Telephone Encounter (Signed)
 Pt was under the Impression that he had an appt every Tuesday. Please call pt to inform him with the correct info.

## 2023-08-17 NOTE — Progress Notes (Signed)
 Patient came in today for a re-wrap of Dynaflex compression wrap to his RLE. I have removed the wrap, washed ulcerative areas with vashe wound solution. Dried leg and foot and applied silvercel to ulcers and 3 layer compression to right lower leg. I did take pictures for his chart prior to doing this. These are available in the media tab. Patient did report that since last week after he originally saw us  he has been feeling much better. Less pain each day and is now able to sleep at night. We will see him next Tuesday 08/24/23 at 1 pm with follow up with Dr. Julio Ohm. Patient had no concerns or complaints.

## 2023-08-19 ENCOUNTER — Ambulatory Visit: Attending: Vascular Surgery | Admitting: Vascular Surgery

## 2023-08-19 ENCOUNTER — Ambulatory Visit (HOSPITAL_COMMUNITY)
Admission: RE | Admit: 2023-08-19 | Discharge: 2023-08-19 | Disposition: A | Discharge status: Home / Self Care | Source: Ambulatory Visit | Attending: Vascular Surgery | Admitting: Vascular Surgery

## 2023-08-19 ENCOUNTER — Other Ambulatory Visit: Payer: Self-pay | Admitting: Vascular Surgery

## 2023-08-19 VITALS — BP 175/89 | HR 63 | Temp 98.0°F

## 2023-08-19 DIAGNOSIS — L97329 Non-pressure chronic ulcer of left ankle with unspecified severity: Secondary | ICD-10-CM | POA: Diagnosis not present

## 2023-08-19 DIAGNOSIS — I83023 Varicose veins of left lower extremity with ulcer of ankle: Secondary | ICD-10-CM | POA: Diagnosis present

## 2023-08-19 NOTE — Progress Notes (Signed)
 Subjective:     Patient ID: Matthew Cole, male   DOB: 08-09-1960, 63 y.o.   MRN: [id number]  HPI 63 year old male status post left small saphenous vein ablation and stab phlebectomy of varicosities of the posterior medial leg tween 10 and 20 sites.  He has followed up with Dr. Julio Ohm currently has an Radio broadcast assistant and is being considered for enrollment in the trial.   Review of Systems No complaints today    Objective:   Physical Exam Vitals:   08/19/23 1025  BP: (!) 175/89  Pulse: 63  Temp: 98 F (36.7 C)  SpO2: 96%   Awake alert and oriented Nonlabored respirations Left lower extremity Unna boot in place  LEFT     CompressibilityPhasicitySpontaneityPropertiesThrombus  Aging  +---------+---------------+---------+-----------+----------+--------------+   CFV     Full           Yes      Yes                                   +---------+---------------+---------+-----------+----------+--------------+   SFJ     Full                                                          +---------+---------------+---------+-----------+----------+--------------+   FV Prox  Full           Yes      Yes                                   +---------+---------------+---------+-----------+----------+--------------+   FV Mid   Full                                                          +---------+---------------+---------+-----------+----------+--------------+   FV DistalFull                                                          +---------+---------------+---------+-----------+----------+--------------+   PFV     Full                    Yes                                   +---------+---------------+---------+-----------+----------+--------------+   SPJ     Full           Yes      Yes                                   +---------+---------------+---------+-----------+----------+--------------+    Summary:   LEFT:  - No  evidence of deep vein thrombosis in the lower extremity. No indirect  evidence of obstruction proximal to the inguinal ligament.  -  No cystic structure found in the popliteal fossa.        Assessment:     63 year old male status post left small saphenous vein ablation without any complications.    Plan:     He will follow with Dr. Julio Ohm for wound care  Follow-up here as needed.    Elorah Dewing C. Vikki Graves, MD [facility] Office: [phone] Pager: [phone]

## 2023-08-24 ENCOUNTER — Ambulatory Visit: Admitting: Orthopedic Surgery

## 2023-08-24 ENCOUNTER — Encounter: Payer: Self-pay | Admitting: Orthopedic Surgery

## 2023-08-24 DIAGNOSIS — I87332 Chronic venous hypertension (idiopathic) with ulcer and inflammation of left lower extremity: Secondary | ICD-10-CM | POA: Diagnosis not present

## 2023-08-24 NOTE — Progress Notes (Signed)
 Office Visit Note   Patient: Matthew Cole           Date of Birth: 1960/06/01           MRN: [id number] Visit Date: 08/24/2023              Requested by: Rae Bugler, MD [phone] [address] [place],  [place] [address] PCP: Rae Bugler, MD  Chief Complaint  Patient presents with   Left Leg - Follow-up      HPI: Patient is seen in follow-up for venous ablation left lower extremity with multiple venous ulcers.  Patient is showing excellent improvement with the venous ulcers status post ablation and compression wraps.  Assessment & Plan: Visit Diagnoses:  1. Chronic venous hypertension (idiopathic) with ulcer and inflammation of left lower extremity (HCC)     Plan: We will reapply Dynaflex compression wrap.  With patient's current rate of healing he should heal these wounds within 30 days.  Follow-Up Instructions: Return in about 1 week (around 08/31/2023).   Ortho Exam  Patient is alert, oriented, no adenopathy, well-dressed, normal affect, normal respiratory effort. Examination the venous ulcers are healing quite rapidly.  There is new epithelialization there is excellent granulation tissue in the wound beds.  Patient has a strong palpable dorsalis pedis pulse.    Imaging: No results found.    Labs: No results found for: HGBA1C, ESRSEDRATE, CRP, LABURIC, REPTSTATUS, GRAMSTAIN, CULT, LABORGA   Lab Results  Component Value Date   ALBUMIN 4.0 12/24/2022   ALBUMIN 3.9 12/22/2022   ALBUMIN 3.9 12/21/2022    Lab Results  Component Value Date   MG 1.8 12/21/2022   No results found for: VD25OH  No results found for: PREALBUMIN    Latest Ref Rng & Units 12/24/2022    8:08 AM 12/22/2022    5:10 AM 12/21/2022    4:39 AM  CBC EXTENDED  WBC 4.0 - 10.5 K/uL 5.4  4.1  3.9   RBC 4.22 - 5.81 MIL/uL 3.95  4.16  3.91   Hemoglobin 13.0 - 17.0 g/dL 08.6  57.8  46.9   HCT 39.0 - 52.0 % 41.0  42.0  40.0   Platelets 150 - 400 K/uL 113  125   113   NEUT# 1.7 - 7.7 K/uL 4.2     Lymph# 0.7 - 4.0 K/uL 0.6        There is no height or weight on file to calculate BMI.  Orders:  No orders of the defined types were placed in this encounter.  No orders of the defined types were placed in this encounter.    Procedures: No procedures performed  Clinical Data: No additional findings.  ROS:  All other systems negative, except as noted in the HPI. Review of Systems  Objective: Vital Signs: There were no vitals taken for this visit.  Specialty Comments:  No specialty comments available.  PMFS History: Patient Active Problem List   Diagnosis Date Noted   Alcohol withdrawal seizure (HCC) 12/20/2022   Venous stasis ulcer of ankle (HCC) 12/20/2022   Alcoholic ketoacidosis 12/20/2022   Ascending aorta dilatation (HCC) 11/06/2022   Mixed hyperlipidemia 11/06/2022   Essential hypertension 11/06/2022   Past Medical History:  Diagnosis Date   Hypertension     Family History  Problem Relation Age of Onset   Cancer Mother    Cancer Father     Past Surgical History:  Procedure Laterality Date   LASER ABLATION Left 08/05/2023  Laser ablation of the left small saphenous vein with 10-20 stab phlebectomies   Social History   Occupational History   Not on file  Tobacco Use   Smoking status: Never   Smokeless tobacco: Never  Vaping Use   Vaping status: Never Used  Substance and Sexual Activity   Alcohol use: Yes    Comment: 1 bottle of wine and a strong liquior drink every night     Drug use: Never   Sexual activity: Not on file

## 2023-08-30 ENCOUNTER — Ambulatory Visit: Attending: Genetic Counselor | Admitting: Genetic Counselor

## 2023-08-30 DIAGNOSIS — I7121 Aneurysm of the ascending aorta, without rupture: Secondary | ICD-10-CM

## 2023-08-31 ENCOUNTER — Ambulatory Visit: Admitting: Orthopedic Surgery

## 2023-08-31 ENCOUNTER — Encounter: Payer: Self-pay | Admitting: Orthopedic Surgery

## 2023-08-31 DIAGNOSIS — I87332 Chronic venous hypertension (idiopathic) with ulcer and inflammation of left lower extremity: Secondary | ICD-10-CM

## 2023-08-31 NOTE — Progress Notes (Addendum)
 Office Visit Note   Patient: Matthew Cole           Date of Birth: 12/15/1960           MRN: [id number] Visit Date: 08/31/2023              Requested by: Seabron Lenis, MD [phone] [address] [place],  [place] [address] PCP: Seabron Lenis, MD  Chief Complaint  Patient presents with   Right Leg - Wound Check      HPI: Patient is a 63 year old gentleman who is seen for initial evaluation for venous leg ulcer left leg. Patient is status post venous ablation on May 30.  He has been managed with Dynaflex compression wraps and elevation when at rest.  He is here for follow up wound exam.    Assessment & Plan: Visit Diagnoses:  1. Chronic venous hypertension (idiopathic) with ulcer and inflammation of left lower extremity (HCC)     Plan: He will get Vive socks to wear as a dressing.  Take off for showers only.  He was given a handout for proper elevation in the supine position demonstration.    Follow-Up Instructions: Return in about 4 weeks (around 09/28/2023).   Ortho Exam  Patient is alert, oriented, no adenopathy, well-dressed, normal affect, normal respiratory effort. Venous ulcers healing well and decreased Edema in the leg.  Palpable DP pulse.    Imaging: No results found.     Labs: No results found for: HGBA1C, ESRSEDRATE, CRP, LABURIC, REPTSTATUS, GRAMSTAIN, CULT, LABORGA   Lab Results  Component Value Date   ALBUMIN 4.0 12/24/2022   ALBUMIN 3.9 12/22/2022   ALBUMIN 3.9 12/21/2022    Lab Results  Component Value Date   MG 1.8 12/21/2022   No results found for: VD25OH  No results found for: PREALBUMIN    Latest Ref Rng & Units 12/24/2022    8:08 AM 12/22/2022    5:10 AM 12/21/2022    4:39 AM  CBC EXTENDED  WBC 4.0 - 10.5 K/uL 5.4  4.1  3.9   RBC 4.22 - 5.81 MIL/uL 3.95  4.16  3.91   Hemoglobin 13.0 - 17.0 g/dL 86.0  85.6  86.6   HCT 39.0 - 52.0 % 41.0  42.0  40.0   Platelets 150 - 400 K/uL 113  125  113   NEUT# 1.7  - 7.7 K/uL 4.2     Lymph# 0.7 - 4.0 K/uL 0.6        There is no height or weight on file to calculate BMI.  Orders:  No orders of the defined types were placed in this encounter.  No orders of the defined types were placed in this encounter.    Procedures: No procedures performed  Clinical Data: No additional findings.  ROS:  All other systems negative, except as noted in the HPI. Review of Systems  Objective: Vital Signs: There were no vitals taken for this visit.  Specialty Comments:  No specialty comments available.  PMFS History: Patient Active Problem List   Diagnosis Date Noted   Alcohol withdrawal seizure (HCC) 12/20/2022   Venous stasis ulcer of ankle (HCC) 12/20/2022   Alcoholic ketoacidosis 12/20/2022   Ascending aorta dilatation (HCC) 11/06/2022   Mixed hyperlipidemia 11/06/2022   Essential hypertension 11/06/2022   Past Medical History:  Diagnosis Date   Hypertension     Family History  Problem Relation Age of Onset   Cancer Mother    Cancer Father  Past Surgical History:  Procedure Laterality Date   LASER ABLATION Left 08/05/2023   Laser ablation of the left small saphenous vein with 10-20 stab phlebectomies   Social History   Occupational History   Not on file  Tobacco Use   Smoking status: Never   Smokeless tobacco: Never  Vaping Use   Vaping status: Never Used  Substance and Sexual Activity   Alcohol use: Yes    Comment: 1 bottle of wine and a strong liquior drink every night     Drug use: Never   Sexual activity: Not on file

## 2023-09-06 NOTE — Progress Notes (Signed)
 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 He reports not having any functional limitations at work.    In 2024, at his annual physical with DR. Swayne of [facility], he was found to have a new heart murmur and was referred to Dr. Elmira. He underwent a CT-A (05/15/2023) that demonstrated fusiform dilation of the ascending aorta upto 4.1 cm, sinus at 4.4 cm and a tortuous thoracic aorta with mild to moderate patchy calcific plaques. He reports being asymptomatic.    Traditional Risk Factors Matthew Cole reports having HTN at 20 that is well controlled with medication.  There is no report of a prior aortic dissection.  Family history  Relation to Proband Pedigree # Current age Heart condition/age of onset Notes  Son IV.5 37 None Cardiac screening to be done  Sisters, 3` III.1- III.3 70, 68, 66 None III.3- Normal cardiac screen Other sisters- screening to be done  Nieces, nephew IV.1-IV.4 40s-30s None         Father II.6 Deceased Dissected thoracic aortic aneurysm @ 57 Died @ 81-COPD  Paternal uncles, aunt II.1-II.5 Deceased Unaware Died @ ? of ?  Paternal grandfather 1.1 Deceased None Died @ 85-old age  Paternal grandmother I.2 Deceased None Died of old age @ 23        Mother II.7 Deceased None Died @ 26- cancer  Maternal uncles, 2 II.8-II.9 Deceased None Died @ 60-M.I. Died @ WW2  Maternal grandfather I.3 Deceased None Died @ 87s-old age  Maternal grandmother I.4 Deceased None Died @ 60s-old age   45 Genetic Consultation Notes  Informed Curlee that while congenital conditions that include a bicuspid aortic valve, conotruncal defects  etc. account for nearly 70% of aortic dilatations (AoD), idiopathic and genetic disorders account for 11% and 17% of AoD, respectively. Patient was counseled on the genetics of syndromes that present with aortic aneurysms and dissections, specifically Marfan syndrome (MFS), Loeys-Dietz syndrome (LDS), Vascular Ehlers-Danlos Syndrome (vEDS) and other non-syndromic familial forms of thoracic aortic aneurysms and dissection (FTAAD).   I explained to him that aortopathies are an autosomal dominant condition. If he has a genetic aortopathy then his children have a 50% chance of inheriting this condition. Also informed him of the high rate of de novo mutations in the syndromic aortopathy conditions. Patient verbalized understanding.   I discussed incomplete penetrance associated with this condition i.e. not all individuals harboring a mutation will present clinically, and age-related penetrance where clinical presentation increases with advanced age. I also reviewed variable expression and emphasized that this condition can express at any age at any level of severity in the family. Hence, it is important for first-degree relatives to undergo CTA screening for aortic aneurysms.   We walked through the process of genetic testing.   I explained that genetic testing is a probabilistic test dependent upon his age and severity of presentation, presence of risk factors and importantly family history of aortic aneurysms or sudden death in first-degree relatives.  The potential outcomes of genetic testing and subsequent management of at-risk family members were discussed so as to manage expectations-  If a mutation is not identified, then first-degree relatives can undergo a baseline cardiac screening for aortic aneurysms.  There is also the likelihood of identifying a "Variant of unknown significance." This result means that the variant has not been detected in a statistically significant number of patients and/or  functional studies have not been performed to verify its pathogenicity, especially since this condition has a high de novo mutation rate. This VUS can be tested in the family to see if it segregates with disease. If a VUS is found, the 2022 ACC/AHA Aortic disease guidelines recommend that first-degree relatives should undergo aortic imaging studies.  If a pathogenic variant is reported, then first-degree family members can get tested for this variant. If they test positive, it is likely they will develop an aortopathy. In light of variable expression and incomplete penetrance associated with this condition, it is not possible to predict when they will manifest clinically with an aneurysm. It is recommended that family members that test positive for the familial pathogenic variant pursue clinical screening by MRI or CT and undergo gene-based management strategies.  Impression  In summary, Matthew Cole presents with an aortic dilatation (AoD) of 4.4 cm at the aortic sinus and a tortuous aorta at age 62 in the absence of risk factors that can lead to an AoD. Additionally, he reports family history of a ruptured thoracic aortic aneurysm in his father at age 60. Considering this, it is highly likely that Matthew Cole inherited this condition from his father.  Genetic testing for the genes implicated in aortopathies is recommended. This test should include the major genes that contribute to MFS, LDS, vEDS and FTAAD.   The genetic test will help confirm his diagnosis and identify the genetic basis of his disease. In addition, confirming the diagnosis will allow appropriate and timely intervention as needed for his condition as guidelines for surgical intervention in patients with Mavis Pyo syndrome is lower (>4.46mm on TEE) than those with MFS (>4.5 -5.0 mm, based on risk factors). Upon completion of the test, patient will undergo a post-test genetic consult to discuss the genetic test results.  Since this is an autosomal  dominant condition, a positive test result will help identify first-degree family members that may harbor  the mutation and are at risk of developing this condition. Appropriate cardiology follow-up and lifestyle management can then be directed to those genotype-positive family members.   In addition, we discussed the protections afforded by the Genetic Information Non-Discrimination Act (GINA). I explained to him that GINA protects him from losing employment or health insurance based on his genotype. However, these protections do not cover life insurance and disability. He verbalized understanding of this and states that his son has life insurance through work.  Please note that the patient has not been counseled in this visit on personal, cultural, or ethical issues that he may face due to his heart condition.   Plan After a thorough discussion of the risk and benefits of genetic testing for aortopathies, Zaydan defers genetic testing for aortopathies. He will discuss screening guidelines with his son and sisters as well as life insurance coverage.     Danford Pac, Ph.D, [facility] Clinical Molecular Geneticist

## 2023-09-08 ENCOUNTER — Telehealth: Payer: Self-pay

## 2023-09-08 DIAGNOSIS — I77810 Thoracic aortic ectasia: Secondary | ICD-10-CM

## 2023-09-08 DIAGNOSIS — I7121 Aneurysm of the ascending aorta, without rupture: Secondary | ICD-10-CM

## 2023-09-08 NOTE — Telephone Encounter (Signed)
 Left message to call back

## 2023-09-08 NOTE — Telephone Encounter (Signed)
 Contacted by Dr. Elmira and advised that patient needs as follows:  Pt needs to be contacted and advised.

## 2023-09-08 NOTE — Telephone Encounter (Signed)
 Left message for pt to call.

## 2023-09-09 NOTE — Telephone Encounter (Signed)
 Left message to call back

## 2023-09-14 NOTE — Telephone Encounter (Signed)
 Left message to call back

## 2023-09-14 NOTE — Telephone Encounter (Signed)
 Pt returned call and he would like to hold off on CT scans at this time.

## 2023-09-14 NOTE — Telephone Encounter (Signed)
 Noted.  Thanks MJP

## 2023-09-24 ENCOUNTER — Ambulatory Visit: Admitting: Nurse Practitioner

## 2023-09-28 ENCOUNTER — Ambulatory Visit: Admitting: Orthopedic Surgery

## 2023-10-04 ENCOUNTER — Ambulatory Visit: Admitting: Orthopedic Surgery

## 2023-10-04 DIAGNOSIS — I87332 Chronic venous hypertension (idiopathic) with ulcer and inflammation of left lower extremity: Secondary | ICD-10-CM | POA: Diagnosis not present

## 2023-10-12 ENCOUNTER — Encounter: Payer: Self-pay | Admitting: Orthopedic Surgery

## 2023-10-12 NOTE — Progress Notes (Signed)
 Office Visit Note   Patient: Matthew Cole           Date of Birth: 1960/03/25           MRN: [id number] Visit Date: 10/04/2023              Requested by: Seabron Lenis, MD [phone],  [place] [address] PCP: Seabron Lenis, MD  Chief Complaint  Patient presents with   Left Leg - Follow-up      HPI: Patient is a 63 year old gentleman who is seen in follow-up for venous insufficiency ulceration left leg.  Patient states he feels well has no concerns.  He states he normally wears the vive compression socks.  Assessment & Plan: Visit Diagnoses:  1. 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  Follow-Up Instructions: Return if symptoms worsen or fail to improve.   Ortho Exam  Patient is alert, oriented, no adenopathy, well-dressed, normal affect, normal respiratory effort. Examination of the left leg venous ulcer is healed there is still some swelling.  Patient has ankle dorsiflexion to neutral.    Imaging: No results found. No images are attached to the encounter.  Labs: No results found for: HGBA1C, ESRSEDRATE, CRP, LABURIC, REPTSTATUS, GRAMSTAIN, CULT, LABORGA   Lab Results  Component Value Date   ALBUMIN 4.0 12/24/2022   ALBUMIN 3.9 12/22/2022   ALBUMIN 3.9 12/21/2022    Lab Results  Component Value Date   MG 1.8 12/21/2022   No results found for: VD25OH  No results found for: PREALBUMIN    Latest Ref Rng & Units 12/24/2022    8:08 AM 12/22/2022    5:10 AM 12/21/2022    4:39 AM  CBC EXTENDED  WBC 4.0 - 10.5 K/uL 5.4  4.1  3.9   RBC 4.22 - 5.81 MIL/uL 3.95  4.16  3.91   Hemoglobin 13.0 - 17.0 g/dL 86.0  85.6  86.6   HCT 39.0 - 52.0 % 41.0  42.0  40.0   Platelets 150 - 400 K/uL 113  125  113   NEUT# 1.7 - 7.7 K/uL 4.2     Lymph# 0.7 - 4.0 K/uL 0.6         There is no height or weight on file to calculate BMI.  Orders:  No orders of the defined types were placed in this encounter.  No orders of the defined types were placed in this encounter.    Procedures: No procedures performed  Clinical Data: No additional findings.  ROS:  All other systems negative, except as noted in the HPI. Review of Systems  Objective: Vital Signs: There were no vitals taken for this visit.  Specialty Comments:  No specialty comments available.  PMFS History: Patient Active Problem List   Diagnosis Date Noted   Alcohol withdrawal seizure (HCC) 12/20/2022   Venous stasis ulcer of ankle (HCC) 12/20/2022   Alcoholic ketoacidosis 12/20/2022   Ascending aorta dilatation (HCC) 11/06/2022   Mixed hyperlipidemia 11/06/2022   Essential hypertension 11/06/2022   Past Medical History:  Diagnosis Date   Hypertension     Family History  Problem Relation Age of Onset   Cancer Mother    Cancer Father     Past Surgical History:  Procedure Laterality Date   LASER ABLATION Left 08/05/2023   Laser ablation of the left small saphenous vein with 10-20 stab phlebectomies  Social History   Occupational History   Not on file  Tobacco Use   Smoking status: Never   Smokeless tobacco: Never  Vaping Use   Vaping status: Never Used  Substance and Sexual Activity   Alcohol use: Yes    Comment: 1 bottle of wine and a strong liquior drink every night     Drug use: Never   Sexual activity: Not on file

## 2023-11-05 ENCOUNTER — Ambulatory Visit: Admitting: Emergency Medicine

## 2023-11-05 NOTE — Progress Notes (Deleted)
  Cardiology Office Note:    Date:  11/05/2023  ID:  Matthew Cole, DOB 1960-04-26, MRN [id number] PCP: Seabron Lenis, MD  [facility] Providers Cardiologist:  Newman JINNY Lawrence, MD { Click to update primary MD,subspecialty MD or APP then REFRESH:1}    {Click to Open Review  :1}   Patient Profile:       Chief Complaint: 52-month follow-up History of Present Illness:  Matthew Cole is a 63 y.o. male with visit-pertinent history of hypertension, hyperlipidemia, ascending aortic dilation with family history of ruptured, alcohol abuse, venous insufficiency  Patient established with cardiology service on 11/06/2022 for management of ascending aortic dilation.  He underwent previous echocardiogram on 10/08/2022 that showed trace MR with EF of 65% with mild AS with moderately dilated ascending aorta.  He was ordered a CTA of the aorta that was not completed.  He was hospitalized on 12/2022 with alcohol withdrawal and was last admitted on 12/24/2022 with complaining of dizziness and lightheadedness.  He was discharged after symptoms were found to be vasovagal.  He was seen in clinic on 05/10/2023 and his blood pressure was noted to be elevated at 170/98.  He underwent repeat echocardiogram that showed LVEF 55-6% with no RWMA and mildly dilated LA, moderate thickening of the AV with mild aortic stenosis and no dilation.  CT angio chest aorta 06/04/2023 showed fusiform dilation of the ascending aorta up to 4.2 cm.  He was last seen in clinic on 06/07/2023.  He was noted to be hypertensive with initial blood pressure 170/90 and recheck 164/94.  His atenolol  50 mg was discontinued and he was started on carvedilol  3.25 mg twice daily.  He was continued on Benicar 40 mg and Norvasc  10 mg daily.  Discussed the use of AI scribe software for clinical note transcription with the patient, who gave verbal consent to proceed.  History of Present Illness     Review of systems:  Please see the history of  present illness. All other systems are reviewed and otherwise negative. ***      Studies Reviewed:        ***  Risk Assessment/Calculations:   {Does this patient have ATRIAL FIBRILLATION?[phone]} No BP recorded.  {Refresh Note OR Click here to enter BP  :1}***        Physical Exam:   VS:  There were no vitals taken for this visit.   Wt Readings from Last 3 Encounters:  08/05/23 231 lb (104.8 kg)  07/15/23 240 lb (108.9 kg)  06/07/23 240 lb 9.6 oz (109.1 kg)    GEN: Well nourished, well developed in no acute distress NECK: No JVD; No carotid bruits CARDIAC: ***RRR, no murmurs, rubs, gallops RESPIRATORY:  Clear to auscultation without rales, wheezing or rhonchi  ABDOMEN: Soft, non-tender, non-distended EXTREMITIES:  No edema; No acute deformity ***      Assessment and Plan:    Assessment and Plan Assessment & Plan      {Are you ordering a CV Procedure (e.g. stress test, cath, DCCV, TEE, etc)?   Press F2        :[id number]}  Dispo:  No follow-ups on file.  Signed, Lum LITTIE Louis, NP

## 2023-11-16 ENCOUNTER — Encounter: Admitting: Genetic Counselor

## 2023-12-01 ENCOUNTER — Emergency Department (HOSPITAL_COMMUNITY)

## 2023-12-01 ENCOUNTER — Other Ambulatory Visit: Payer: Self-pay

## 2023-12-01 ENCOUNTER — Inpatient Hospital Stay (HOSPITAL_COMMUNITY)
Admission: EM | Admit: 2023-12-01 | Discharge: 2023-12-03 | DRG: 897 | Disposition: A | Discharge status: Home / Self Care | Attending: Internal Medicine | Admitting: Internal Medicine

## 2023-12-01 ENCOUNTER — Encounter (HOSPITAL_COMMUNITY): Payer: Self-pay

## 2023-12-01 DIAGNOSIS — I83028 Varicose veins of left lower extremity with ulcer other part of lower leg: Secondary | ICD-10-CM | POA: Diagnosis present

## 2023-12-01 DIAGNOSIS — I10 Essential (primary) hypertension: Secondary | ICD-10-CM | POA: Diagnosis present

## 2023-12-01 DIAGNOSIS — I739 Peripheral vascular disease, unspecified: Secondary | ICD-10-CM | POA: Diagnosis present

## 2023-12-01 DIAGNOSIS — E872 Acidosis, unspecified: Secondary | ICD-10-CM | POA: Diagnosis present

## 2023-12-01 DIAGNOSIS — T148XXA Other injury of unspecified body region, initial encounter: Secondary | ICD-10-CM

## 2023-12-01 DIAGNOSIS — Z79899 Other long term (current) drug therapy: Secondary | ICD-10-CM | POA: Diagnosis not present

## 2023-12-01 DIAGNOSIS — F10930 Alcohol use, unspecified with withdrawal, uncomplicated: Secondary | ICD-10-CM | POA: Diagnosis not present

## 2023-12-01 DIAGNOSIS — L97829 Non-pressure chronic ulcer of other part of left lower leg with unspecified severity: Secondary | ICD-10-CM | POA: Diagnosis present

## 2023-12-01 DIAGNOSIS — Z881 Allergy status to other antibiotic agents status: Secondary | ICD-10-CM

## 2023-12-01 DIAGNOSIS — E860 Dehydration: Secondary | ICD-10-CM | POA: Diagnosis present

## 2023-12-01 DIAGNOSIS — F10939 Alcohol use, unspecified with withdrawal, unspecified: Principal | ICD-10-CM | POA: Diagnosis present

## 2023-12-01 DIAGNOSIS — F10239 Alcohol dependence with withdrawal, unspecified: Principal | ICD-10-CM | POA: Diagnosis present

## 2023-12-01 DIAGNOSIS — Y902 Blood alcohol level of 40-59 mg/100 ml: Secondary | ICD-10-CM | POA: Diagnosis present

## 2023-12-01 DIAGNOSIS — K7010 Alcoholic hepatitis without ascites: Secondary | ICD-10-CM | POA: Diagnosis present

## 2023-12-01 LAB — URINE DRUG SCREEN
Amphetamines: NEGATIVE
Barbiturates: NEGATIVE
Benzodiazepines: NEGATIVE
Cocaine: NEGATIVE
Fentanyl: NEGATIVE
Methadone Scn, Ur: NEGATIVE
Opiates: NEGATIVE
Tetrahydrocannabinol: NEGATIVE

## 2023-12-01 LAB — CBC WITH DIFFERENTIAL/PLATELET
Abs Immature Granulocytes: 0.02 K/uL (ref 0.00–0.07)
Basophils Absolute: 0 K/uL (ref 0.0–0.1)
Basophils Relative: 0 %
Eosinophils Absolute: 0 K/uL (ref 0.0–0.5)
Eosinophils Relative: 1 %
HCT: 44.4 % (ref 39.0–52.0)
Hemoglobin: 14.7 g/dL (ref 13.0–17.0)
Immature Granulocytes: 0 %
Lymphocytes Relative: 29 %
Lymphs Abs: 2 K/uL (ref 0.7–4.0)
MCH: 33.9 pg (ref 26.0–34.0)
MCHC: 33.1 g/dL (ref 30.0–36.0)
MCV: 102.5 fL — ABNORMAL HIGH (ref 80.0–100.0)
Monocytes Absolute: 0.7 K/uL (ref 0.1–1.0)
Monocytes Relative: 10 %
Neutro Abs: 4.1 K/uL (ref 1.7–7.7)
Neutrophils Relative %: 60 %
Platelets: 191 K/uL (ref 150–400)
RBC: 4.33 MIL/uL (ref 4.22–5.81)
RDW: 12.9 % (ref 11.5–15.5)
WBC: 6.9 K/uL (ref 4.0–10.5)
nRBC: 0 % (ref 0.0–0.2)

## 2023-12-01 LAB — BLOOD GAS, VENOUS
Acid-Base Excess: 1.3 mmol/L (ref 0.0–2.0)
Bicarbonate: 25 mmol/L (ref 20.0–28.0)
O2 Saturation: 96.3 %
Patient temperature: 37
pCO2, Ven: 36 mmHg — ABNORMAL LOW (ref 44–60)
pH, Ven: 7.45 — ABNORMAL HIGH (ref 7.25–7.43)
pO2, Ven: 71 mmHg — ABNORMAL HIGH (ref 32–45)

## 2023-12-01 LAB — COMPREHENSIVE METABOLIC PANEL WITH GFR
ALT: 63 U/L — ABNORMAL HIGH (ref 0–44)
AST: 106 U/L — ABNORMAL HIGH (ref 15–41)
Albumin: 4.3 g/dL (ref 3.5–5.0)
Alkaline Phosphatase: 85 U/L (ref 38–126)
Anion gap: 20 — ABNORMAL HIGH (ref 5–15)
BUN: 11 mg/dL (ref 8–23)
CO2: 20 mmol/L — ABNORMAL LOW (ref 22–32)
Calcium: 9.5 mg/dL (ref 8.9–10.3)
Chloride: 96 mmol/L — ABNORMAL LOW (ref 98–111)
Creatinine, Ser: 0.92 mg/dL (ref 0.61–1.24)
GFR, Estimated: >60 mL/min (ref >60)
Glucose, Bld: 125 mg/dL — ABNORMAL HIGH (ref 70–99)
Potassium: 4.1 mmol/L (ref 3.5–5.1)
Sodium: 135 mmol/L (ref 135–145)
Total Bilirubin: 1.1 mg/dL (ref 0.0–1.2)
Total Protein: 8.4 g/dL — ABNORMAL HIGH (ref 6.5–8.1)

## 2023-12-01 LAB — URINALYSIS, W/ REFLEX TO CULTURE (INFECTION SUSPECTED)
Bacteria, UA: NONE SEEN
Bilirubin Urine: NEGATIVE
Glucose, UA: NEGATIVE mg/dL
Ketones, ur: 5 mg/dL — AB
Leukocytes,Ua: NEGATIVE
Nitrite: NEGATIVE
Protein, ur: 100 mg/dL — AB
Specific Gravity, Urine: 1.018 (ref 1.005–1.030)
pH: 5 (ref 5.0–8.0)

## 2023-12-01 LAB — CBG MONITORING, ED: Glucose-Capillary: 128 mg/dL — ABNORMAL HIGH (ref 70–99)

## 2023-12-01 LAB — PROTIME-INR
INR: 1.1 (ref 0.8–1.2)
Prothrombin Time: 14.4 s (ref 11.4–15.2)

## 2023-12-01 LAB — I-STAT CG4 LACTIC ACID, ED
Lactic Acid, Venous: 2.8 mmol/L (ref 0.5–1.9)
Lactic Acid, Venous: 2.1 mmol/L (ref 0.5–1.9)

## 2023-12-01 LAB — LIPASE, BLOOD: Lipase: 39 U/L (ref 11–51)

## 2023-12-01 LAB — TROPONIN T, HIGH SENSITIVITY
Troponin T High Sensitivity: 19 ng/L (ref 0–19)
Troponin T High Sensitivity: 21 ng/L — ABNORMAL HIGH (ref 0–19)

## 2023-12-01 LAB — ETHANOL: Alcohol, Ethyl (B): 50 mg/dL — ABNORMAL HIGH (ref <15)

## 2023-12-01 MED ORDER — ONDANSETRON HCL 4 MG/2ML IJ SOLN: 4.0000 mg | Freq: Four times a day (QID) | INTRAMUSCULAR | Status: DC | PRN

## 2023-12-01 MED ORDER — HYDROXYZINE HCL 25 MG PO TABS: 25.0000 mg | ORAL_TABLET | Freq: Four times a day (QID) | ORAL | Status: DC | PRN

## 2023-12-01 MED ORDER — ACETAMINOPHEN 650 MG RE SUPP: 650.0000 mg | Freq: Four times a day (QID) | RECTAL | Status: DC | PRN

## 2023-12-01 MED ORDER — CHLORDIAZEPOXIDE HCL 25 MG PO CAPS: 25.0000 mg | ORAL_CAPSULE | Freq: Four times a day (QID) | ORAL | Status: DC | PRN

## 2023-12-01 MED ORDER — CHLORDIAZEPOXIDE HCL 25 MG PO CAPS
25.0000 mg | ORAL_CAPSULE | Freq: Four times a day (QID) | ORAL | Status: AC
Start: 2023-12-01 — End: 2023-12-01
  Administered 2023-12-01 (×4): 25 mg via ORAL
  Filled 2023-12-01 (×4): qty 1

## 2023-12-01 MED ORDER — ALBUTEROL SULFATE (2.5 MG/3ML) 0.083% IN NEBU: 2.5000 mg | INHALATION_SOLUTION | RESPIRATORY_TRACT | Status: DC | PRN

## 2023-12-01 MED ORDER — OXYCODONE HCL 5 MG PO TABS: 5.0000 mg | ORAL_TABLET | ORAL | Status: DC | PRN

## 2023-12-01 MED ORDER — CHLORDIAZEPOXIDE HCL 25 MG PO CAPS
25.0000 mg | ORAL_CAPSULE | ORAL | Status: DC
  Administered 2023-12-03: 25 mg via ORAL
  Filled 2023-12-01: qty 1

## 2023-12-01 MED ORDER — CHLORDIAZEPOXIDE HCL 25 MG PO CAPS
25.0000 mg | ORAL_CAPSULE | Freq: Three times a day (TID) | ORAL | Status: AC
  Administered 2023-12-02 (×3): 25 mg via ORAL
  Filled 2023-12-01 (×3): qty 1

## 2023-12-01 MED ORDER — MEDIHONEY WOUND/BURN DRESSING EX PSTE
1.0000 | PASTE | Freq: Every day | CUTANEOUS | Status: DC
Start: 2023-12-01 — End: 2023-12-01
  Filled 2023-12-01: qty 44

## 2023-12-01 MED ORDER — THIAMINE MONONITRATE 100 MG PO TABS: 100.0000 mg | ORAL_TABLET | Freq: Every day | ORAL | Status: DC

## 2023-12-01 MED ORDER — ENOXAPARIN SODIUM 40 MG/0.4ML IJ SOSY
40.0000 mg | PREFILLED_SYRINGE | INTRAMUSCULAR | Status: DC
  Administered 2023-12-02: 40 mg via SUBCUTANEOUS
  Filled 2023-12-01 (×2): qty 0.4

## 2023-12-01 MED ORDER — LOPERAMIDE HCL 2 MG PO CAPS: 2.0000 mg | ORAL_CAPSULE | ORAL | Status: DC | PRN

## 2023-12-01 MED ORDER — AMLODIPINE BESYLATE 10 MG PO TABS
10.0000 mg | ORAL_TABLET | Freq: Every day | ORAL | Status: DC
  Administered 2023-12-01 – 2023-12-03 (×3): 10 mg via ORAL
  Filled 2023-12-01: qty 2
  Filled 2023-12-01 (×2): qty 1

## 2023-12-01 MED ORDER — LORAZEPAM 1 MG PO TABS: 0.0000 mg | ORAL_TABLET | Freq: Four times a day (QID) | ORAL | Status: DC

## 2023-12-01 MED ORDER — ADULT MULTIVITAMIN W/MINERALS CH
1.0000 | ORAL_TABLET | Freq: Every day | ORAL | Status: DC
  Administered 2023-12-01 – 2023-12-03 (×3): 1 via ORAL
  Filled 2023-12-01 (×3): qty 1

## 2023-12-01 MED ORDER — LORAZEPAM 2 MG/ML IJ SOLN: 0.0000 mg | Freq: Two times a day (BID) | INTRAMUSCULAR | Status: DC

## 2023-12-01 MED ORDER — LACTATED RINGERS IV BOLUS
1000.0000 mL | Freq: Once | INTRAVENOUS | Status: AC
  Administered 2023-12-01: 1000 mL via INTRAVENOUS

## 2023-12-01 MED ORDER — TRAZODONE HCL 50 MG PO TABS
150.0000 mg | ORAL_TABLET | Freq: Every evening | ORAL | Status: DC | PRN
  Administered 2023-12-01 – 2023-12-02 (×2): 150 mg via ORAL
  Filled 2023-12-01 (×2): qty 1

## 2023-12-01 MED ORDER — LORAZEPAM 2 MG/ML IJ SOLN: 0.0000 mg | Freq: Four times a day (QID) | INTRAMUSCULAR | Status: DC

## 2023-12-01 MED ORDER — COLLAGENASE 250 UNIT/GM EX OINT
1.0000 | TOPICAL_OINTMENT | Freq: Every day | CUTANEOUS | Status: DC
  Administered 2023-12-01 – 2023-12-03 (×3): 1 via TOPICAL
  Filled 2023-12-01: qty 30

## 2023-12-01 MED ORDER — ONDANSETRON HCL 4 MG PO TABS: 4.0000 mg | ORAL_TABLET | Freq: Four times a day (QID) | ORAL | Status: DC | PRN

## 2023-12-01 MED ORDER — CHLORDIAZEPOXIDE HCL 25 MG PO CAPS
25.0000 mg | ORAL_CAPSULE | Freq: Once | ORAL | Status: AC
  Administered 2023-12-01: 25 mg via ORAL
  Filled 2023-12-01: qty 1

## 2023-12-01 MED ORDER — LORAZEPAM 2 MG/ML IJ SOLN: 1.0000 mg | Freq: Once | INTRAMUSCULAR | Status: AC

## 2023-12-01 MED ORDER — CHLORDIAZEPOXIDE HCL 25 MG PO CAPS: 25.0000 mg | ORAL_CAPSULE | Freq: Every day | ORAL | Status: DC

## 2023-12-01 MED ORDER — THIAMINE HCL 100 MG/ML IJ SOLN
100.0000 mg | Freq: Every day | INTRAMUSCULAR | Status: DC
  Administered 2023-12-01: 100 mg via INTRAVENOUS
  Filled 2023-12-01: qty 2

## 2023-12-01 MED ORDER — LORAZEPAM 2 MG/ML IJ SOLN
INTRAMUSCULAR | Status: AC
  Administered 2023-12-01: 1 mg via INTRAVENOUS
  Filled 2023-12-01: qty 1

## 2023-12-01 MED ORDER — LORAZEPAM 2 MG/ML IJ SOLN
1.0000 mg | Freq: Once | INTRAMUSCULAR | Status: AC
Start: 2023-12-01 — End: 2023-12-01
  Administered 2023-12-01: 1 mg via INTRAVENOUS
  Filled 2023-12-01: qty 1

## 2023-12-01 MED ORDER — LORAZEPAM 2 MG/ML IJ SOLN
1.0000 mg | Freq: Once | INTRAMUSCULAR | Status: AC
  Administered 2023-12-01: 1 mg via INTRAVENOUS

## 2023-12-01 MED ORDER — LORAZEPAM 2 MG/ML IJ SOLN
1.0000 mg | INTRAMUSCULAR | Status: DC | PRN
  Administered 2023-12-01: 1 mg via INTRAVENOUS
  Filled 2023-12-01: qty 1

## 2023-12-01 MED ORDER — LORAZEPAM 1 MG PO TABS: 1.0000 mg | ORAL_TABLET | ORAL | Status: DC | PRN

## 2023-12-01 MED ORDER — THIAMINE MONONITRATE 100 MG PO TABS
100.0000 mg | ORAL_TABLET | Freq: Every day | ORAL | Status: DC
  Administered 2023-12-02 – 2023-12-03 (×2): 100 mg via ORAL
  Filled 2023-12-01 (×2): qty 1

## 2023-12-01 MED ORDER — LORAZEPAM 1 MG PO TABS: 0.0000 mg | ORAL_TABLET | Freq: Two times a day (BID) | ORAL | Status: DC

## 2023-12-01 MED ORDER — CARVEDILOL 3.125 MG PO TABS
3.1250 mg | ORAL_TABLET | Freq: Two times a day (BID) | ORAL | Status: DC
Start: 2023-12-01 — End: 2023-12-03
  Administered 2023-12-01 – 2023-12-03 (×5): 3.125 mg via ORAL
  Filled 2023-12-01 (×5): qty 1

## 2023-12-01 MED ORDER — LACTATED RINGERS IV SOLN: INTRAVENOUS | Status: DC

## 2023-12-01 MED ORDER — ACETAMINOPHEN 325 MG PO TABS: 650.0000 mg | ORAL_TABLET | Freq: Four times a day (QID) | ORAL | Status: DC | PRN

## 2023-12-01 NOTE — ED Triage Notes (Signed)
 Pt reports with tremors that have worsened since yesterday. Pt also has wounds to left shin and left toe (have been there a while). Pt reports drinking every day but has been cutting back.

## 2023-12-01 NOTE — Plan of Care (Signed)

## 2023-12-01 NOTE — H&P (Signed)
 History and Physical  BERNARD SLAYDEN FMW:[id number] DOB: 12-29-1960 DOA: 12/01/2023  PCP: Seabron Lenis, MD   Chief Complaint: Nausea, weakness, tremors  HPI: Matthew Cole is a 63 y.o. male with medical history significant for hypertension, alcohol abuse and prior history of alcohol withdrawal seizures being admitted to the hospital with 3 days of nausea, weakness, tremors and concern for acute alcohol withdrawal.  History is provided by the patient and his wife, they state that he drinks alcohol daily, 1-2 bottles of wine in addition to occasional mixed drinks.  He says that he last had gin on Saturday, since that day has had some wine here and there, has been trying to cut back.  He has been feeling nauseous, tremulous.  They deny any seizures, fevers, chills, chest pain, nausea, vomiting.  He does have chronic left lower extremity wounds, denies any drainage, feels that they are slightly more red than at baseline, and his left second toe has been hurting.  He admits that he has been stumbling around and may have hit his toe on something.  Review of Systems: Please see HPI for pertinent positives and negatives. A complete 10 system review of systems are otherwise negative.  Past Medical History:  Diagnosis Date   Hypertension    Past Surgical History:  Procedure Laterality Date   LASER ABLATION Left 08/05/2023   Laser ablation of the left small saphenous vein with 10-20 stab phlebectomies   Social History:  reports that he has never smoked. He has never used smokeless tobacco. He reports current alcohol use. He reports that he does not use drugs.  Allergies  Allergen Reactions   Ciprofloxacin Other (See Comments)    Ascending Aortic Aneurysm- increases risk of Aortic Dissection    Family History  Problem Relation Age of Onset   Cancer Mother    Cancer Father      Prior to Admission medications   Medication Sig Start Date End Date Taking? Authorizing Provider  amLODipine   (NORVASC ) 10 MG tablet Take 10 mg by mouth daily.    [provider]  carvedilol  (COREG ) 3.125 MG tablet TAKE 1 TABLET BY MOUTH 2 TIMES DAILY. 07/13/23   Wyn Jackee VEAR Mickey., NP  cephALEXin  (KEFLEX ) 500 MG capsule Take 1 capsule (500 mg total) by mouth 3 (three) times daily. 07/26/23   Bethanie Cough, PA-C  folic acid  (FOLVITE ) 1 MG tablet Take 1 tablet (1 mg total) by mouth daily. 12/22/22   Briana Elgin LABOR, MD  Multiple Vitamin (MULTIVITAMIN WITH MINERALS) TABS tablet Take 1 tablet by mouth daily.    [provider]  olmesartan (BENICAR) 40 MG tablet Take 40 mg by mouth daily. 07/05/20   [provider]  rosuvastatin  (CRESTOR ) 10 MG tablet Take 10 mg by mouth daily.    [provider]  thiamine  (VITAMIN B-1) 100 MG tablet Take 1 tablet (100 mg total) by mouth daily. 12/22/22   Briana Elgin LABOR, MD  traZODone  (DESYREL ) 150 MG tablet Take 150 mg by mouth at bedtime.    [provider]    Physical Exam: BP (!) 157/86   Pulse 64   Temp 98.4 F (36.9 C) (Oral)   Resp 18   SpO2 97%  General:  Alert, oriented, calm, in no acute distress looks slightly anxious, but not tremulous and in no acute distress.  His wife is at the bedside. Cardiovascular: RRR, no murmurs or rubs, no peripheral edema  Respiratory: clear to auscultation bilaterally, no wheezes, no crackles  Abdomen: soft, nontender, nondistended, normal bowel tones heard  Skin: dry, no rashes, he has chronic appearing left anterior shin and left great and second toe wounds, there is only very minimal surrounding erythema, no areas of tenderness or fluctuance.  No drainage. Musculoskeletal: no joint effusions, normal range of motion  Psychiatric: appropriate affect, normal speech  Neurologic: extraocular muscles intact, clear speech, moving all extremities with intact sensorium         Labs on Admission:  Basic Metabolic Panel: Recent Labs  Lab 12/01/23 0733  NA 135  K 4.1  CL 96*  CO2 20*   GLUCOSE 125*  BUN 11  CREATININE 0.92  CALCIUM  9.5   Liver Function Tests: Recent Labs  Lab 12/01/23 0733  AST 106*  ALT 63*  ALKPHOS 85  BILITOT 1.1  PROT 8.4*  ALBUMIN 4.3   Recent Labs  Lab 12/01/23 0755  LIPASE 39   No results for input(s): AMMONIA in the last 168 hours. CBC: Recent Labs  Lab 12/01/23 0733  WBC 6.9  NEUTROABS 4.1  HGB 14.7  HCT 44.4  MCV 102.5*  PLT 191   Cardiac Enzymes: No results for input(s): CKTOTAL, CKMB, CKMBINDEX, TROPONINI in the last 168 hours. BNP (last 3 results) No results for input(s): BNP in the last 8760 hours.  ProBNP (last 3 results) No results for input(s): PROBNP in the last 8760 hours.  CBG: Recent Labs  Lab 12/01/23 0733  GLUCAP 128*    Radiological Exams on Admission: DG Chest Port 1 View Result Date: 12/01/2023 CLINICAL DATA:  Possible sepsis. EXAM: PORTABLE CHEST 1 VIEW COMPARISON:  CT chest 06/04/2023 FINDINGS: Lungs are adequately inflated and otherwise clear. Borderline cardiomegaly. Mediastinal structures unremarkable. Minimal degenerative change of the spine. IMPRESSION: 1. No acute cardiopulmonary disease. 2. Borderline cardiomegaly. Electronically Signed   By: Toribio Agreste M.D.   On: 12/01/2023 08:42   DG Tibia/Fibula Left Result Date: 12/01/2023 CLINICAL DATA:  Possible sepsis. Wound over the left lower leg and toe. EXAM: LEFT TIBIA AND FIBULA - 2 VIEW COMPARISON:  None Available. FINDINGS: There is no evidence of fracture or other focal bone lesions. Soft tissues are unremarkable. IMPRESSION: Negative. Electronically Signed   By: Toribio Agreste M.D.   On: 12/01/2023 08:41   DG Foot Complete Left Result Date: 12/01/2023 CLINICAL DATA:  Tremors.  Wounds to left shin and left toe. EXAM: LEFT FOOT - COMPLETE 3+ VIEW COMPARISON:  None Available. FINDINGS: Minimal degenerate change of the first MTP joint and interphalangeal joints. Moderate degenerative changes over the midfoot. Flexion at the  second PIP joint make in evaluation difficult. Moderate size inferior/posterior calcaneal spur. IMPRESSION: 1. No acute findings. 2. Degenerative changes as described. Electronically Signed   By: Toribio Agreste M.D.   On: 12/01/2023 08:40   Assessment/Plan KAID SEEBERGER is a 63 y.o. male with medical history significant for hypertension, alcohol abuse and prior history of alcohol withdrawal seizures being admitted to the hospital with 3 days of nausea, weakness, tremors and concern for acute alcohol withdrawal.   Acute alcohol withdrawal-in this patient with a prior history of hospitalization October 2024 with alcohol withdrawal seizure.  Currently his tremors have resolved, his mental status is normal.  Last drink of liquor was about 3 days ago, but he did have some wine this morning. -Inpatient admission -Start Librium  taper -IV/p.o. Ativan  as needed per CIWA score -Thiamine , folate, multivitamin -TOC consult  Hypertension-amlodipine , Coreg   Alcoholic hepatitis-hold statin for now, trend LFTs  Lactic acid-likely  due to dehydration, sepsis not suspected.  Improving with fluids.  Venous stasis ulcerations-these appear stable, and not infected  DVT prophylaxis: Lovenox      Code Status: Full Code  Consults called: None  Admission status: The appropriate patient status for this patient is INPATIENT. Inpatient status is judged to be reasonable and necessary in order to provide the required intensity of service to ensure the patient's safety. The patient's presenting symptoms, physical exam findings, and initial radiographic and laboratory data in the context of their chronic comorbidities is felt to place them at high risk for further clinical deterioration. Furthermore, it is not anticipated that the patient will be medically stable for discharge from the hospital within 2 midnights of admission.    I certify that at the point of admission it is my clinical judgment that the patient will  require inpatient hospital care spanning beyond 2 midnights from the point of admission due to high intensity of service, high risk for further deterioration and high frequency of surveillance required  Time spent: 53 minutes  Milli Woolridge CHRISTELLA Gail MD [facility] Pager [phone]  If 7PM-7AM, please contact night-coverage [email or web address] Password [email or web address]  12/01/2023, 10:38 AM

## 2023-12-01 NOTE — Consult Note (Signed)
 WOC Nurse Consult Note: Reason for Consult:Patient with chronic venous hypertension.  Sees Dr Harden. Presents today with nonintact ulcer to left anterior lower leg.  Left second metatarsal with nonintact wound to dorsal aspect.  Right great toe with nail avulsion consistent with trauma.  Left toe and leg wounds are surrounded by erythema and warmth.  Wound type: infectious, venous, trauma Pressure Injury POA: NA Measurement:Left second toe:  nonintact abrasion to dorsal toe with scabbing and erythema Left third dorsal toe with 0.3 cm scabbed abrasion Left anterior lower leg chronic wound Wound azi:izcpujopszi tissue Right great toe with nail avulsion and scabbing, dried blood Drainage (amount, consistency, odor) minimal serosanguinous   Periwound: erythema and warmth to left leg and foot.  Dressing procedure/placement/frequency: Cleanse wounds to left toes, right great toe and left anterior lower leg with NS and pat dry.  Paint right great toe with betadine daily.  Apply medihoney to wounds on left lower extremity and foot.  Cover with gauze and kerlix/tape.  Dry dressing to right great toe if draining.  If dry, leave open to air.  Will not follow at this time.  Please re-consult if needed.  Darice Cooley MSN, RN, FNP-BC CWON Wound, Ostomy, Continence Nurse Outpatient [facility] [phone] Work cell phone:  [phone]

## 2023-12-01 NOTE — ED Provider Notes (Signed)
 [place] EMERGENCY DEPARTMENT AT [facility] Provider Note   CSN: [id number] Arrival date & time: 12/01/23  9273     Patient presents with: Tremors   Matthew Cole is a 63 y.o. male.   HPI Patient reports he started feeling sick on Saturday, 4 days ago.  Patient reports he just felt generally unwell with nausea and weakness.  Patient denies she has had any fever.  No headache.  No chest pain.  No productive cough or shortness of breath.  No significant abdominal pain.  Patient reports that he has been cutting back on alcohol consumption.  Patient reports his typical workday is from 4 AM until 10 AM.  Patient reports typically, after 10 AM he has a bloody Mary and sips on wine through the day.  He estimates he drinks 1-1-1/2 bottles of wine per day.  Patient reports that by yesterday morning he felt kind of shaky and bad so he had a glass of wine in the morning and 2 in the evening.  He reports today however symptoms of gotten much worse and he is having severe shaking that he cannot control and started profusely sweating.  Patient reports this morning he was very nauseated and had 1 episode of vomiting.  He denies there was any headache or visual disturbance associated with this.  Patient does have chronic wounds on the left lower extremity with peripheral vascular disease.  He reports that the chronic wounds have been more red over about the past week.  He denies that he has developed a fever since they became more red.  He reports there sensitive but not terribly painful.  He does report that the bottoms of his feet feel heavy and tingly.    Prior to Admission medications   Medication Sig Start Date End Date Taking? Authorizing Provider  amLODipine  (NORVASC ) 10 MG tablet Take 10 mg by mouth daily.    [provider]  carvedilol  (COREG ) 3.125 MG tablet TAKE 1 TABLET BY MOUTH 2 TIMES DAILY. 07/13/23   Wyn Jackee VEAR Mickey., NP  cephALEXin  (KEFLEX ) 500 MG capsule Take 1 capsule  (500 mg total) by mouth 3 (three) times daily. 07/26/23   Bethanie Cough, PA-C  folic acid  (FOLVITE ) 1 MG tablet Take 1 tablet (1 mg total) by mouth daily. 12/22/22   Briana Elgin LABOR, MD  Multiple Vitamin (MULTIVITAMIN WITH MINERALS) TABS tablet Take 1 tablet by mouth daily.    [provider]  olmesartan (BENICAR) 40 MG tablet Take 40 mg by mouth daily. 07/05/20   [provider]  rosuvastatin  (CRESTOR ) 10 MG tablet Take 10 mg by mouth daily.    [provider]  thiamine  (VITAMIN B-1) 100 MG tablet Take 1 tablet (100 mg total) by mouth daily. 12/22/22   Briana Elgin LABOR, MD  traZODone  (DESYREL ) 150 MG tablet Take 150 mg by mouth at bedtime.    [provider]    Allergies: Ciprofloxacin    Review of Systems  Updated Vital Signs BP (!) 157/86   Pulse 64   Temp 98.4 F (36.9 C) (Oral)   Resp 18   SpO2 97%   Physical Exam Constitutional:      Comments: Patient is alert.  Clear mental status.  No respiratory distress.  Profusely diaphoretic.  Visibly tremulous.  Well nourished well-developed.  HENT:     Head: Normocephalic and atraumatic.     Mouth/Throat:     Pharynx: Oropharynx is clear.  Eyes:  Extraocular Movements: Extraocular movements intact.     Comments: Appears to have mild icterus.  Cardiovascular:     Rate and Rhythm: Regular rhythm. Tachycardia present.     Pulses: Normal pulses.     Comments: Radial pulses 2+ and strong.  Dorsalis pedis pulses are 2+ and strong. Pulmonary:     Effort: Pulmonary effort is normal.     Breath sounds: Normal breath sounds.  Abdominal:     General: There is no distension.     Palpations: Abdomen is soft.     Tenderness: There is no abdominal tenderness. There is no guarding.  Musculoskeletal:     Cervical back: Neck supple.     Comments: Patient does not have any peripheral edema.  Calves are soft and pliable.  Feet have no edema.  Feet are warm and dry to the touch.  Dorsalis pedis pulses are 2+  and strong.  Patient's left foot on second toe has a fairly large ulcerative wound and the toe is diffusely erythematous.  There is a chronic appearing abrasion type ulceration to the anterior tibial surface on the left lower leg.  Lower leg has some diffuse blanching erythema.  See attached images.  Skin:    Comments: Patient is profusely diaphoretic on the upper body.  Otherwise warm and dry.  Neurological:     Comments: Patient is alert with clear mental status.  He is answering questions appropriately.  He has significant visible tremor throughout the extremities.  No focal motor deficits.          (all labs ordered are listed, but only abnormal results are displayed) Labs Reviewed  CBC WITH DIFFERENTIAL/PLATELET - Abnormal; Notable for the following components:      Result Value   MCV 102.5 (*)    All other components within normal limits  COMPREHENSIVE METABOLIC PANEL WITH GFR - Abnormal; Notable for the following components:   Chloride 96 (*)    CO2 20 (*)    Glucose, Bld 125 (*)    Total Protein 8.4 (*)    AST 106 (*)    ALT 63 (*)    Anion gap 20 (*)    All other components within normal limits  ETHANOL - Abnormal; Notable for the following components:   Alcohol, Ethyl (B) 50 (*)    All other components within normal limits  BLOOD GAS, VENOUS - Abnormal; Notable for the following components:   pH, Ven 7.45 (*)    pCO2, Ven 36 (*)    pO2, Ven 71 (*)    All other components within normal limits  URINALYSIS, W/ REFLEX TO CULTURE (INFECTION SUSPECTED) - Abnormal; Notable for the following components:   Hgb urine dipstick SMALL (*)    Ketones, ur 5 (*)    Protein, ur 100 (*)    All other components within normal limits  CBG MONITORING, ED - Abnormal; Notable for the following components:   Glucose-Capillary 128 (*)    All other components within normal limits  I-STAT CG4 LACTIC ACID, ED - Abnormal; Notable for the following components:   Lactic Acid, Venous 2.8 (*)     All other components within normal limits  I-STAT CG4 LACTIC ACID, ED - Abnormal; Notable for the following components:   Lactic Acid, Venous 2.1 (*)    All other components within normal limits  CULTURE, BLOOD (ROUTINE X 2)  CULTURE, BLOOD (ROUTINE X 2)  PROTIME-INR  LIPASE, BLOOD  URINE DRUG SCREEN  TROPONIN T, HIGH SENSITIVITY  TROPONIN T, HIGH SENSITIVITY    EKG: EKG Interpretation Date/Time:  Wednesday December 01 2023 07:42:36 EDT Ventricular Rate:  69 PR Interval:  157 QRS Duration:  109 QT Interval:  415 QTC Calculation: 445 R Axis:   0  Text Interpretation: Sinus rhythm Ventricular premature complex Baseline wander in lead(s) I V3 V6 agree, no sig change from previous Confirmed by Armenta Canning [phone]) on 12/01/2023 9:07:49 AM  Radiology: [facility] Chest Port 1 View Result Date: 12/01/2023 CLINICAL DATA:  Possible sepsis. EXAM: PORTABLE CHEST 1 VIEW COMPARISON:  CT chest 06/04/2023 FINDINGS: Lungs are adequately inflated and otherwise clear. Borderline cardiomegaly. Mediastinal structures unremarkable. Minimal degenerative change of the spine. IMPRESSION: 1. No acute cardiopulmonary disease. 2. Borderline cardiomegaly. Electronically Signed   By: Toribio Agreste M.D.   On: 12/01/2023 08:42   DG Tibia/Fibula Left Result Date: 12/01/2023 CLINICAL DATA:  Possible sepsis. Wound over the left lower leg and toe. EXAM: LEFT TIBIA AND FIBULA - 2 VIEW COMPARISON:  None Available. FINDINGS: There is no evidence of fracture or other focal bone lesions. Soft tissues are unremarkable. IMPRESSION: Negative. Electronically Signed   By: Toribio Agreste M.D.   On: 12/01/2023 08:41   DG Foot Complete Left Result Date: 12/01/2023 CLINICAL DATA:  Tremors.  Wounds to left shin and left toe. EXAM: LEFT FOOT - COMPLETE 3+ VIEW COMPARISON:  None Available. FINDINGS: Minimal degenerate change of the first MTP joint and interphalangeal joints. Moderate degenerative changes over the midfoot. Flexion at the  second PIP joint make in evaluation difficult. Moderate size inferior/posterior calcaneal spur. IMPRESSION: 1. No acute findings. 2. Degenerative changes as described. Electronically Signed   By: Toribio Agreste M.D.   On: 12/01/2023 08:40     Procedures  CRITICAL CARE Performed by: Canning Armenta   Total critical care time: 30 minutes  Critical care time was exclusive of separately billable procedures and treating other patients.  Critical care was necessary to treat or prevent imminent or life-threatening deterioration.  Critical care was time spent personally by me on the following activities: development of treatment plan with patient and/or surrogate as well as nursing, discussions with consultants, evaluation of patient's response to treatment, examination of patient, obtaining history from patient or surrogate, ordering and performing treatments and interventions, ordering and review of laboratory studies, ordering and review of radiographic studies, pulse oximetry and re-evaluation of patient's condition.  Medications Ordered in the ED  lactated ringers  infusion ( Intravenous New Bag/Given 12/01/23 0823)  LORazepam  (ATIVAN ) injection 0-4 mg ( Intravenous Not Given 12/01/23 0805)    Or  LORazepam  (ATIVAN ) tablet 0-4 mg ( Oral See Alternative 12/01/23 0805)  LORazepam  (ATIVAN ) injection 0-4 mg (has no administration in time range)    Or  LORazepam  (ATIVAN ) tablet 0-4 mg (has no administration in time range)  thiamine  (VITAMIN B1) tablet 100 mg ( Oral See Alternative 12/01/23 0949)    Or  thiamine  (VITAMIN B1) injection 100 mg (100 mg Intravenous Given 12/01/23 0949)  LORazepam  (ATIVAN ) injection 1 mg (1 mg Intravenous Given 12/01/23 0750)  LORazepam  (ATIVAN ) injection 1 mg (1 mg Intravenous Given 12/01/23 0753)  lactated ringers  bolus 1,000 mL (0 mLs Intravenous Stopped 12/01/23 0908)  LORazepam  (ATIVAN ) injection 1 mg (1 mg Intravenous Given 12/01/23 0908)                                     Medical Decision Making Amount and/or  Complexity of Data Reviewed Labs: ordered. Radiology: ordered.  Risk OTC drugs. Prescription drug management. Decision regarding hospitalization.   Patient presents as outlined.  Differential diagnosis includes alcohol withdrawal and sepsis.  Patient's mental status is clear to person place and time.  Patient gives history of recently cutting back and trying to quit alcohol.  Patient will require CIWA scale and Ativan  for alcohol withdrawal.  2 mg of Ativan  administered initially with improving response.  Patient also has source for potential sepsis with increased erythema around chronic wounds.  Will initiate sepsis protocol.  Recheck 8: 30.  Patient has had 2 mg of Ativan .  Patient is alert.  Mental status is clear.  Patient is oriented to person place and date.  Patient follows commands appropriately without difficulty.  Patient is denying auditory or visual hallucination.  Diaphoresis has resolved.  Patient is less tremulous than previous at rest.  With hands extended, patient has pronounced tremor.  Patient denies any history of any significant baseline tremor.  Will administer another milligram of Ativan .  Based on my initial CIWA score and current symptoms, CIWA is from 20-22.  Lactic 2.1 troponin 19 lipase 39 PT/INR normal.  Venous gas 7.4 anion gap 20 white count 6.9 H&H 14 and 44 ethanol 50  Patient presents with significant alcohol withdrawal.  He does have chronic lower extremity wounds which seem to have some additional erythema however no fever and no leukocytosis.  At this time it appears most likely that all of the acute symptoms of profuse diaphoresis and tremulousness are secondary to alcohol withdrawal.  Patient will require admission for alcohol withdrawal.  At this time I have not empirically started antibiotics for secondary infection of chronic wounds.  These may be suitable for continued observation and consultation if needed.   Patient has good arterial circulation with strong dorsalis pedis pulses and warm dry feet.  Per review of notes patient has had some venous problems with varicosities but on exam and by examination of the skin condition of the remainder of the lower leg, it does not appear that patient suffers from chronic edema and skin thinning.  It is not exactly clear why he has had such difficulty healing these chronic foot wounds in the setting.  Consult: Dr. Roxane Triad hospitalist for admission.     Final diagnoses:  Alcohol withdrawal syndrome with complication [facility])  Chronic wound    ED Discharge Orders     None          Armenta Canning, MD 12/01/23 [phone]

## 2023-12-02 DIAGNOSIS — F10939 Alcohol use, unspecified with withdrawal, unspecified: Secondary | ICD-10-CM | POA: Diagnosis not present

## 2023-12-02 LAB — COMPREHENSIVE METABOLIC PANEL WITH GFR
ALT: 42 U/L (ref 0–44)
AST: 61 U/L — ABNORMAL HIGH (ref 15–41)
Albumin: 3.7 g/dL (ref 3.5–5.0)
Alkaline Phosphatase: 66 U/L (ref 38–126)
Anion gap: 14 (ref 5–15)
BUN: 11 mg/dL (ref 8–23)
CO2: 22 mmol/L (ref 22–32)
Calcium: 9.3 mg/dL (ref 8.9–10.3)
Chloride: 98 mmol/L (ref 98–111)
Creatinine, Ser: 0.88 mg/dL (ref 0.61–1.24)
GFR, Estimated: >60 mL/min (ref >60)
Glucose, Bld: 111 mg/dL — ABNORMAL HIGH (ref 70–99)
Potassium: 3.9 mmol/L (ref 3.5–5.1)
Sodium: 134 mmol/L — ABNORMAL LOW (ref 135–145)
Total Bilirubin: 0.9 mg/dL (ref 0.0–1.2)
Total Protein: 7.2 g/dL (ref 6.5–8.1)

## 2023-12-02 LAB — CBC
HCT: 40.7 % (ref 39.0–52.0)
Hemoglobin: 13 g/dL (ref 13.0–17.0)
MCH: 33.1 pg (ref 26.0–34.0)
MCHC: 31.9 g/dL (ref 30.0–36.0)
MCV: 103.6 fL — ABNORMAL HIGH (ref 80.0–100.0)
Platelets: 129 K/uL — ABNORMAL LOW (ref 150–400)
RBC: 3.93 MIL/uL — ABNORMAL LOW (ref 4.22–5.81)
RDW: 12.7 % (ref 11.5–15.5)
WBC: 4 K/uL (ref 4.0–10.5)
nRBC: 0 % (ref 0.0–0.2)

## 2023-12-02 MED ORDER — FOLIC ACID 1 MG PO TABS
1.0000 mg | ORAL_TABLET | Freq: Every day | ORAL | Status: DC
Start: 2023-12-02 — End: 2023-12-03
  Administered 2023-12-02 – 2023-12-03 (×2): 1 mg via ORAL
  Filled 2023-12-02 (×2): qty 1

## 2023-12-02 MED ORDER — GABAPENTIN 300 MG PO CAPS
600.0000 mg | ORAL_CAPSULE | Freq: Two times a day (BID) | ORAL | Status: DC
  Administered 2023-12-02 – 2023-12-03 (×3): 600 mg via ORAL
  Filled 2023-12-02 (×3): qty 2

## 2023-12-02 NOTE — Plan of Care (Signed)
  Problem: Health Behavior/Discharge Planning: Goal: Ability to manage health-related needs will improve Outcome: Progressing   Problem: Activity: Goal: Risk for activity intolerance will decrease Outcome: Progressing   Problem: Nutrition: Goal: Adequate nutrition will be maintained Outcome: Progressing   

## 2023-12-02 NOTE — Progress Notes (Signed)
 Mobility Specialist - Progress Note   12/02/23 1428  Mobility  Activity Ambulated with assistance  Level of Assistance Contact guard assist, steadying assist  Assistive Device Front wheel walker  Distance Ambulated (ft) 200 ft  Range of Motion/Exercises Active  Activity Response Tolerated well  Mobility Referral Yes  Mobility visit 1 Mobility  Mobility Specialist Start Time (ACUTE ONLY) 1414  Mobility Specialist Stop Time (ACUTE ONLY) 1428  Mobility Specialist Time Calculation (min) (ACUTE ONLY) 14 min   Pt was found on recliner chair and agreeable to mobilize. C/o tremors inhibiting gait and requested to use RW. At EOS returned to bed with all needs met. Call bell in reach.   Erminio Leos,  Mobility Specialist Can be reached via Secure Chat

## 2023-12-02 NOTE — Progress Notes (Signed)
 PROGRESS NOTE    Matthew Cole  FMW:[id number] DOB: 08/25/1960 DOA: 12/01/2023 PCP: Seabron Lenis, MD    Brief Narrative:   Matthew Cole is a 63 y.o. male with past medical history significant for HTN, EtOH use disorder with history of alcohol withdrawal seizures who presented to [facility] ED on 12/01/2023 with nausea, weakness, tremors and concern for acute alcohol withdrawal.  History provided by patient and spouse.  Patient reports he drinks alcohol daily, 1-2 bottles of wine in addition to occasional mixed drinks.  Last had gin on Saturday but since has had some wine occasionally.  Reports trying to cut back.  Endorses feeling nauseous and tremulous.  Patient with several wounds to his lower extremity/feet from stumbling around.  Denies fever/chills/night sweats, no chest pain, no palpitations, no vomiting, no diarrhea no seizures, no abdominal pain, no cough/congestion, no focal weakness, no paresthesias.  In the ED, temperature 98.4 F, HR 110, RR 19, BP 176/103, SpO2 97% on room air.  WBC 6.9, hemoglobin 14.7, platelet count 191.  Sodium 135, potassium 4.1, chloride 96, CO2 20, glucose 125, BUN 11, creatinine 0.92.  Lipase 39.  AST 106, ALT 63, total bilirubin 1.1.  High-sensitivity troponin 19>21.  Lactic acid 2.8>2.1.  INR 1.1.  Urinalysis negative.  UDS negative.  EtOH level 50.  Chest x-ray with no active cardiopulmonary disease process, noted borderline cardiomegaly.  Left foot, left tibia/fibula x-ray with no acute findings.  Patient was started on IV fluids, Ativan , B1 injection by EDP.  TRH was consulted for admission for further evaluation and management of impending EtOH withdrawal.  Assessment & Plan:   Acute alcohol withdrawal Hx of alcohol withdrawal seizures Patient presenting with nausea, tremors as he has been trying to cut back on his alcohol intake.  At baseline drinks daily, 1-2 bottles of wine as well as occasional mixed drinks.  History of alcohol withdrawal seizures  in the past. -- Librium  taper -- CIWA protocol with symptom triggered Ativan  -- Gabapentin  600 mg p.o. twice daily -- Thiamine , folate, multivitamin -- TOC consultation for substance abuse resources  Lactic acidosis Etiology likely secondary to dehydration in the setting of alcohol use disorder. -- Lactic acid 2.8>2.1 -- Continue to encourage increased oral intake -- Repeat lactic acid in a.m.  Alcoholic hepatitis LFTs notably elevated in the setting of heavy alcohol use disorder.  Counseled on need for complete cessation. -- AST 106>61 -- ALT 63>42 -- Tbili 1.1>0.9 -- Holding home statin -- Repeat LFTs in a.m.  HTN -- Amlodipine  10 mg p.o. daily -- Carvedilol  3.125 mg p.o. twice daily  Venous stasis ulcerations Follows with orthopedic/wound care outpatient, Dr. Harden.  Stable, no appearance of active infection.  Seen by wound care RN. Cleanse wounds to left toes, right great toe and left anterior lower leg with NS and pat dry. Paint right great toe with betadine daily. Apply medihoney to wounds on left lower extremity and foot.  Cover with gauze and kerlix/tape.  Dry dressing to right great toe if draining.  If dry, leave open to air.  -- Continue outpatient follow-up  DVT prophylaxis: enoxaparin  (LOVENOX ) injection 40 mg Start: 12/01/23 1200    Code Status: Full Code Family Communication: No family present at bedside  Disposition Plan:  Level of care: Progressive Status is: Inpatient Remains inpatient appropriate because: Active EtOH withdrawal    Consultants:  None  Procedures:  None  Antimicrobials:  None   Subjective: Patient seen and examined at bedside, lying in bed.  Reports symptoms improved.  But continues with tremors.  Discussed with RN this morning, patient was very unsteady in regards to his gait while trying to ambulate to the bathroom this morning.  Continues on Librium  taper, Ativan  as needed under CIWA protocol.  Discussed adding gabapentin .  Also  discussed need for complete cessation/abstinence from alcohol in the future.  No other questions or concerns at this time.  Denies headache, no dizziness, no chest pain, no palpitations, no shortness of breath, no abdominal pain, no fever/chills/night sweats, no current nausea, no vomiting/diarrhea, no focal weakness, no fatigue, no paresthesia.  No acute events overnight per nursing staff.  Objective: Vitals:   12/01/23 2037 12/01/23 2047 12/01/23 2250 12/02/23 0433  BP: (!) 138/92  135/86 135/88  Pulse: 70  74 73  Resp:   16 18  Temp:   97.8 F (36.6 C) 97.8 F (36.6 C)  TempSrc:   Oral Oral  SpO2:   96% 94%  Weight:  103.3 kg    Height:  6' 5 (1.956 m)      Intake/Output Summary (Last 24 hours) at 12/02/2023 1049 Last data filed at 12/02/2023 0300 Gross per 24 hour  Intake 252.23 ml  Output 400 ml  Net -147.77 ml   Filed Weights   12/01/23 2047  Weight: 103.3 kg    Examination:  Physical Exam: GEN: NAD, alert and oriented x 3, wd/wn, tremulousness HEENT: NCAT, PERRL, EOMI, sclera clear, MMM PULM: CTAB w/o wheezes/crackles, normal respiratory effort CV: RRR w/o M/G/R GI: abd soft, NTND, NABS, no R/G/M MSK: no peripheral edema, muscle strength globally intact 5/5 bilateral upper/lower extremities NEURO: No focal neurological deficit other than noted hand tremors PSYCH: normal mood/affect Integumentary: Chronic wounds noted to bilateral lower extremities suspected below, stable otherwise no other concerning rashes/lesions/wounds noted on exposed skin surfaces          Data Reviewed: I have personally reviewed following labs and imaging studies  CBC: Recent Labs  Lab 12/01/23 0733 12/02/23 0412  WBC 6.9 4.0  NEUTROABS 4.1  --   HGB 14.7 13.0  HCT 44.4 40.7  MCV 102.5* 103.6*  PLT 191 129*   Basic Metabolic Panel: Recent Labs  Lab 12/01/23 0733 12/02/23 0412  NA 135 134*  K 4.1 3.9  CL 96* 98  CO2 20* 22  GLUCOSE 125* 111*  BUN 11 11  CREATININE  0.92 0.88  CALCIUM  9.5 9.3   GFR: Estimated Creatinine Clearance: 106.9 mL/min (by C-G formula based on SCr of 0.88 mg/dL). Liver Function Tests: Recent Labs  Lab 12/01/23 0733 12/02/23 0412  AST 106* 61*  ALT 63* 42  ALKPHOS 85 66  BILITOT 1.1 0.9  PROT 8.4* 7.2  ALBUMIN 4.3 3.7   Recent Labs  Lab 12/01/23 0755  LIPASE 39   No results for input(s): AMMONIA in the last 168 hours. Coagulation Profile: Recent Labs  Lab 12/01/23 0755  INR 1.1   Cardiac Enzymes: No results for input(s): CKTOTAL, CKMB, CKMBINDEX, TROPONINI in the last 168 hours. BNP (last 3 results) No results for input(s): PROBNP in the last 8760 hours. HbA1C: No results for input(s): HGBA1C in the last 72 hours. CBG: Recent Labs  Lab 12/01/23 0733  GLUCAP 128*   Lipid Profile: No results for input(s): CHOL, HDL, LDLCALC, TRIG, CHOLHDL, LDLDIRECT in the last 72 hours. Thyroid Function Tests: No results for input(s): TSH, T4TOTAL, FREET4, T3FREE, THYROIDAB in the last 72 hours. Anemia Panel: No results for input(s): VITAMINB12, FOLATE, FERRITIN, TIBC, IRON,  RETICCTPCT in the last 72 hours. Sepsis Labs: Recent Labs  Lab 12/01/23 0835 12/01/23 1002  LATICACIDVEN 2.8* 2.1*    Recent Results (from the past 240 hours)  Blood Culture (routine x 2)     Status: None (Preliminary result)   Collection Time: 12/01/23  7:55 AM   Specimen: BLOOD  Result Value Ref Range Status   Specimen Description   Final    BLOOD BLOOD RIGHT HAND Performed at [facility], [address] [place], [place] [address]    Special Requests   Final    BOTTLES DRAWN AEROBIC AND ANAEROBIC Blood Culture results may not be optimal due to an inadequate volume of blood received in culture bottles Performed at [facility], [address] [place], [place] [address]    Culture   Final    NO GROWTH < 24 HOURS Performed at [facility], [address] [place], [place] [address]    Report Status PENDING  Incomplete  Blood Culture (routine x 2)     Status: None (Preliminary result)   Collection Time: 12/01/23  8:00 AM   Specimen: BLOOD  Result Value Ref Range Status   Specimen Description   Final    BLOOD LEFT ANTECUBITAL Performed at [facility], [address], [place], [place] [address]    Special Requests   Final    BOTTLES DRAWN AEROBIC AND ANAEROBIC Blood Culture results may not be optimal due to an inadequate volume of blood received in culture bottles Performed at [facility], [address], [place], [place] [address]    Culture   Final    NO GROWTH < 24 HOURS Performed at [facility], [address] [place], [place] [address]    Report Status PENDING  Incomplete         Radiology Studies: DG Chest Port 1 View Result Date: 12/01/2023 CLINICAL DATA:  Possible sepsis. EXAM: PORTABLE CHEST 1 VIEW COMPARISON:  CT chest 06/04/2023 FINDINGS: Lungs are adequately inflated and otherwise clear. Borderline cardiomegaly. Mediastinal structures unremarkable. Minimal degenerative change of the spine. IMPRESSION: 1. No acute cardiopulmonary disease. 2. Borderline cardiomegaly. Electronically Signed   By: Toribio Agreste M.D.   On: 12/01/2023 08:42   DG Tibia/Fibula Left Result Date: 12/01/2023 CLINICAL DATA:  Possible sepsis. Wound over the left lower leg and toe. EXAM: LEFT TIBIA AND FIBULA - 2 VIEW COMPARISON:  None Available. FINDINGS: There is no evidence of fracture or other focal bone lesions. Soft tissues are unremarkable. IMPRESSION: Negative. Electronically Signed   By: Toribio Agreste M.D.   On: 12/01/2023 08:41   DG Foot Complete Left Result Date: 12/01/2023 CLINICAL DATA:  Tremors.  Wounds to left shin and left toe. EXAM: LEFT FOOT - COMPLETE 3+ VIEW COMPARISON:  None Available. FINDINGS: Minimal degenerate change of the first MTP joint and interphalangeal joints.  Moderate degenerative changes over the midfoot. Flexion at the second PIP joint make in evaluation difficult. Moderate size inferior/posterior calcaneal spur. IMPRESSION: 1. No acute findings. 2. Degenerative changes as described. Electronically Signed   By: Toribio Agreste M.D.   On: 12/01/2023 08:40        Scheduled Meds:  amLODipine   10 mg Oral Daily   carvedilol   3.125 mg Oral BID   chlordiazePOXIDE   25 mg Oral TID   Followed by   NOREEN ON 12/03/2023] chlordiazePOXIDE   25 mg Oral BH-qamhs   Followed by   NOREEN ON 12/04/2023]  chlordiazePOXIDE   25 mg Oral Daily   collagenase   1 Application Topical Daily   enoxaparin  (LOVENOX ) injection  40 mg Subcutaneous Q24H   gabapentin   600 mg Oral BID   multivitamin with minerals  1 tablet Oral Daily   thiamine   100 mg Oral Daily   Continuous Infusions:   LOS: 1 day    Time spent: 52 minutes spent on 12/02/2023 caring for this patient face-to-face including chart review, ordering labs/tests, documenting, discussion with nursing staff, consultants, updating family and interview/physical exam    Camellia PARAS [place], DO [facility] Available via Epic secure chat 7am-7pm After these hours, please refer to coverage provider listed on [email or web address] 12/02/2023, 10:49 AM

## 2023-12-03 ENCOUNTER — Other Ambulatory Visit (HOSPITAL_COMMUNITY): Payer: Self-pay

## 2023-12-03 DIAGNOSIS — F10939 Alcohol use, unspecified with withdrawal, unspecified: Secondary | ICD-10-CM | POA: Diagnosis not present

## 2023-12-03 LAB — COMPREHENSIVE METABOLIC PANEL WITH GFR
ALT: 40 U/L (ref 0–44)
AST: 52 U/L — ABNORMAL HIGH (ref 15–41)
Albumin: 3.7 g/dL (ref 3.5–5.0)
Alkaline Phosphatase: 65 U/L (ref 38–126)
Anion gap: 13 (ref 5–15)
BUN: 14 mg/dL (ref 8–23)
CO2: 24 mmol/L (ref 22–32)
Calcium: 9.5 mg/dL (ref 8.9–10.3)
Chloride: 100 mmol/L (ref 98–111)
Creatinine, Ser: 0.86 mg/dL (ref 0.61–1.24)
GFR, Estimated: >60 mL/min (ref >60)
Glucose, Bld: 115 mg/dL — ABNORMAL HIGH (ref 70–99)
Potassium: 3.8 mmol/L (ref 3.5–5.1)
Sodium: 137 mmol/L (ref 135–145)
Total Bilirubin: 0.7 mg/dL (ref 0.0–1.2)
Total Protein: 7 g/dL (ref 6.5–8.1)

## 2023-12-03 LAB — LACTIC ACID, PLASMA: Lactic Acid, Venous: 0.9 mmol/L (ref 0.5–1.9)

## 2023-12-03 MED ORDER — CHLORDIAZEPOXIDE HCL 25 MG PO CAPS
ORAL_CAPSULE | ORAL | 0 refills | Status: AC
  Filled 2023-12-03: qty 2, 2d supply, fill #0

## 2023-12-03 MED ORDER — GABAPENTIN 300 MG PO CAPS
600.0000 mg | ORAL_CAPSULE | Freq: Two times a day (BID) | ORAL | 0 refills | Status: AC
  Filled 2023-12-03: qty 120, 30d supply, fill #0

## 2023-12-03 NOTE — Discharge Summary (Signed)
 Physician Discharge Summary  Matthew Cole FMW:[id number] DOB: [birth year] DOA: 12/01/2023  PCP: Seabron Lenis, MD  Admit date: 12/01/2023 Discharge date: 12/03/2023  Admitted From: Home Disposition: Home  Recommendations for Outpatient Follow-up:  Follow up with PCP in 1-2 weeks Continue Librium  taper on discharge Continue to encourage complete abstinence/cessation from alcohol use in the future, especially given his prior history of alcohol withdrawal seizures  Home Health: No Equipment/Devices: None  Discharge Condition: Stable CODE STATUS: Full code Diet recommendation: Heart healthy diet  History of present illness:  Matthew Cole is a 63 y.o. male with past medical history significant for HTN, EtOH use disorder with history of alcohol withdrawal seizures who presented to [facility] ED on 12/01/2023 with nausea, weakness, tremors and concern for acute alcohol withdrawal.  History provided by patient and spouse.  Patient reports he drinks alcohol daily, 1-2 bottles of wine in addition to occasional mixed drinks.  Last had gin on Saturday but since has had some wine occasionally.  Reports trying to cut back.  Endorses feeling nauseous and tremulous.  Patient with several wounds to his lower extremity/feet from stumbling around.  Denies fever/chills/night sweats, no chest pain, no palpitations, no vomiting, no diarrhea no seizures, no abdominal pain, no cough/congestion, no focal weakness, no paresthesias.   In the ED, temperature 98.4 F, HR 110, RR 19, BP 176/103, SpO2 97% on room air.  WBC 6.9, hemoglobin 14.7, platelet count 191.  Sodium 135, potassium 4.1, chloride 96, CO2 20, glucose 125, BUN 11, creatinine 0.92.  Lipase 39.  AST 106, ALT 63, total bilirubin 1.1.  High-sensitivity troponin 19>21.  Lactic acid 2.8>2.1.  INR 1.1.  Urinalysis negative.  UDS negative.  EtOH level 50.  Chest x-ray with no active cardiopulmonary disease process, noted borderline cardiomegaly.  Left foot, left  tibia/fibula x-ray with no acute findings.  Patient was started on IV fluids, Ativan , B1 injection by EDP.  TRH was consulted for admission for further evaluation and management of impending EtOH withdrawal.  Hospital course:  Acute alcohol withdrawal Hx of alcohol withdrawal seizures Patient presenting with nausea, tremors as he has been trying to cut back on his alcohol intake.  At baseline drinks daily, 1-2 bottles of wine as well as occasional mixed drinks.  History of alcohol withdrawal seizures in the past.  Patient was admitted under CIWA protocol with symptom triggered Ativan  and Librium  taper.  Patient was started on gabapentin  600 mg p.o. twice daily.  CIWA scores improved with improvement of symptoms.  Discussed extensively during his hospitalization need for complete cessation/abstinence of alcohol in the future.  Will continue Librium  taper to complete on 12/04/2023.  Continue gabapentin  600 mg p.o. twice daily.  Continue thiamine , folate, multivitamin.  Substance abuse resources given.  Outpatient follow-up with PCP.   Lactic acidosis: Resolved Etiology likely secondary to dehydration in the setting of alcohol use disorder.  Patient was supported with IV fluid hydration, lactic acid 2.8 on admission, improved to 0.9 at time of discharge.   Alcoholic hepatitis LFTs notably elevated in the setting of heavy alcohol use disorder.  Counseled on need for complete cessation.  LFTs trended down during hospitalization.  May resume statin on discharge.   HTN Continue amlodipine  10 mg p.o. daily, Carvedilol  3.125 mg p.o. twice daily   Venous stasis ulcerations Follows with orthopedic/wound care outpatient, Dr. Harden.  Stable, no appearance of active infection.  Seen by wound care RN. Cleanse wounds to left toes, right great toe and left anterior lower  leg with NS and pat dry. Paint right great toe with betadine daily. Apply medihoney to wounds on left lower extremity and foot.  Cover with gauze  and kerlix/tape.  Dry dressing to right great toe if draining.  If dry, leave open to air.  Outpatient follow-up with Dr. Harden.   Discharge Diagnoses:  Principal Problem:   Alcohol withdrawal [facility])    Discharge Instructions  Discharge Instructions     Diet - low sodium heart healthy   Complete by: As directed    Discharge wound care:   Complete by: As directed    Cleanse wounds to left toes, right great toe and left anterior lower leg with NS and pat dry.  Paint right great toe with betadine daily. Apply medihoney to wounds on left lower extremity and foot.  Cover with gauze and kerlix/tape.  Dry dressing to right great toe if draining.  If dry, leave open to air.   Increase activity slowly   Complete by: As directed       Allergies as of 12/03/2023       Reactions   Ciprofloxacin Other (See Comments)   Ascending Aortic Aneurysm- increases risk of Aortic Dissection        Medication List     TAKE these medications    amLODipine  10 MG tablet Commonly known as: NORVASC  Take 10 mg by mouth daily.   carvedilol  3.125 MG tablet Commonly known as: COREG  TAKE 1 TABLET BY MOUTH 2 TIMES DAILY.   chlordiazePOXIDE  25 MG capsule Commonly known as: LIBRIUM  Take 1 capsule (25 mg total) by mouth daily for 1 day, THEN 1 capsule (25 mg total) daily for 1 day. Take 25 mg capsule tonight (9/26) and 1 additional 25 mg capsule tomorrow (9/27) to complete course. Start taking on: December 03, 2023   folic acid  1 MG tablet Commonly known as: FOLVITE  Take 1 tablet (1 mg total) by mouth daily.   gabapentin  300 MG capsule Commonly known as: NEURONTIN  Take 2 capsules (600 mg total) by mouth 2 (two) times daily.   multivitamin with minerals Tabs tablet Take 1 tablet by mouth daily.   olmesartan 40 MG tablet Commonly known as: BENICAR Take 40 mg by mouth daily.   rosuvastatin  10 MG tablet Commonly known as: CRESTOR  Take 10 mg by mouth daily.   thiamine  100 MG tablet Commonly  known as: Vitamin B-1 Take 1 tablet (100 mg total) by mouth daily.   traZODone  150 MG tablet Commonly known as: DESYREL  Take 150 mg by mouth at bedtime.               Discharge Care Instructions  (From admission, onward)           Start     Ordered   12/03/23 0000  Discharge wound care:       Comments: Cleanse wounds to left toes, right great toe and left anterior lower leg with NS and pat dry.  Paint right great toe with betadine daily. Apply medihoney to wounds on left lower extremity and foot.  Cover with gauze and kerlix/tape.  Dry dressing to right great toe if draining.  If dry, leave open to air.   12/03/23 1053            Follow-up Information     Seabron Lenis, MD. Schedule an appointment as soon as possible for a visit in 1 week(s).   Specialty: Family Medicine Contact information: [phone] [place] [address] [phone]  Allergies  Allergen Reactions   Ciprofloxacin Other (See Comments)    Ascending Aortic Aneurysm- increases risk of Aortic Dissection    Consultations: None   Procedures/Studies: DG Chest Port 1 View Result Date: 12/01/2023 CLINICAL DATA:  Possible sepsis. EXAM: PORTABLE CHEST 1 VIEW COMPARISON:  CT chest 06/04/2023 FINDINGS: Lungs are adequately inflated and otherwise clear. Borderline cardiomegaly. Mediastinal structures unremarkable. Minimal degenerative change of the spine. IMPRESSION: 1. No acute cardiopulmonary disease. 2. Borderline cardiomegaly. Electronically Signed   By: Toribio Agreste M.D.   On: 12/01/2023 08:42   DG Tibia/Fibula Left Result Date: 12/01/2023 CLINICAL DATA:  Possible sepsis. Wound over the left lower leg and toe. EXAM: LEFT TIBIA AND FIBULA - 2 VIEW COMPARISON:  None Available. FINDINGS: There is no evidence of fracture or other focal bone lesions. Soft tissues are unremarkable. IMPRESSION: Negative. Electronically Signed   By: Toribio Agreste M.D.   On:  12/01/2023 08:41   DG Foot Complete Left Result Date: 12/01/2023 CLINICAL DATA:  Tremors.  Wounds to left shin and left toe. EXAM: LEFT FOOT - COMPLETE 3+ VIEW COMPARISON:  None Available. FINDINGS: Minimal degenerate change of the first MTP joint and interphalangeal joints. Moderate degenerative changes over the midfoot. Flexion at the second PIP joint make in evaluation difficult. Moderate size inferior/posterior calcaneal spur. IMPRESSION: 1. No acute findings. 2. Degenerative changes as described. Electronically Signed   By: Toribio Agreste M.D.   On: 12/01/2023 08:40     Subjective: Patient seen examined bedside, lying in bed.  Eating breakfast.  No complaints this morning.  No further tremors.  Ready for discharge home.  Discussed once again need for complete alcohol cessation/abstinence given his prior history of withdrawal seizures and recurrent withdrawal symptoms.  No other physical plaints, questions, concerns at this time.  Discussed with RN this morning.  Denies headache, no visual changes, no chest pain, no palpitations, no shortness of breath, no abdominal pain, no fever/chills/night sweats, no nausea/vomiting/diarrhea, no focal weakness, no fatigue, no paresthesias.  No acute events overnight per nursing staff.  Discharge Exam: Vitals:   12/02/23 2058 12/03/23 0540  BP: 134/77 118/79  Pulse: 78 67  Resp: 16 20  Temp: 98 F (36.7 C) 97.8 F (36.6 C)  SpO2: 96% 93%   Vitals:   12/02/23 0433 12/02/23 1414 12/02/23 2058 12/03/23 0540  BP: 135/88 104/70 134/77 118/79  Pulse: 73 93 78 67  Resp: 18 16 16 20   Temp: 97.8 F (36.6 C) 98.4 F (36.9 C) 98 F (36.7 C) 97.8 F (36.6 C)  TempSrc: Oral Oral Oral Oral  SpO2: 94% 99% 96% 93%  Weight:      Height:        Physical Exam: GEN: NAD, alert and oriented x 3, wd/wn, tremulousness HEENT: NCAT, PERRL, EOMI, sclera clear, MMM PULM: CTAB w/o wheezes/crackles, normal respiratory effort CV: RRR w/o M/G/R GI: abd soft, NTND,  NABS, no R/G/M MSK: no peripheral edema, muscle strength globally intact 5/5 bilateral upper/lower extremities NEURO: No focal neurological deficit other than noted hand tremors PSYCH: normal mood/affect Integumentary: Chronic wounds noted to bilateral lower extremities suspected below, stable otherwise no other concerning rashes/lesions/wounds noted on exposed skin surfaces          The results of significant diagnostics from this hospitalization (including imaging, microbiology, ancillary and laboratory) are listed below for reference.     Microbiology: Recent Results (from the past 240 hours)  Blood Culture (routine x 2)     Status: None (Preliminary  result)   Collection Time: 12/01/23  7:55 AM   Specimen: BLOOD  Result Value Ref Range Status   Specimen Description   Final    BLOOD BLOOD RIGHT HAND Performed at [facility], [address] [place], [place] [address]    Special Requests   Final    BOTTLES DRAWN AEROBIC AND ANAEROBIC Blood Culture results may not be optimal due to an inadequate volume of blood received in culture bottles Performed at [facility], [address] [place], [place] [address]    Culture   Final    NO GROWTH 2 DAYS Performed at [facility], [address] [place], [place] [address]    Report Status PENDING  Incomplete  Blood Culture (routine x 2)     Status: None (Preliminary result)   Collection Time: 12/01/23  8:00 AM   Specimen: BLOOD  Result Value Ref Range Status   Specimen Description   Final    BLOOD LEFT ANTECUBITAL Performed at [facility], [address] [place], [place] [address]    Special Requests   Final    BOTTLES DRAWN AEROBIC AND ANAEROBIC Blood Culture results may not be optimal due to an inadequate volume of blood received in culture bottles Performed at [facility], [address] [place], [place] [address]    Culture   Final    NO  GROWTH 2 DAYS Performed at [facility], [address], [place], [place] [address]    Report Status PENDING  Incomplete     Labs: BNP (last 3 results) No results for input(s): BNP in the last 8760 hours. Basic Metabolic Panel: Recent Labs  Lab 12/01/23 0733 12/02/23 0412 12/03/23 0343  NA 135 134* 137  K 4.1 3.9 3.8  CL 96* 98 100  CO2 20* 22 24  GLUCOSE 125* 111* 115*  BUN 11 11 14   CREATININE 0.92 0.88 0.86  CALCIUM  9.5 9.3 9.5   Liver Function Tests: Recent Labs  Lab 12/01/23 0733 12/02/23 0412 12/03/23 0343  AST 106* 61* 52*  ALT 63* 42 40  ALKPHOS 85 66 65  BILITOT 1.1 0.9 0.7  PROT 8.4* 7.2 7.0  ALBUMIN 4.3 3.7 3.7   Recent Labs  Lab 12/01/23 0755  LIPASE 39   No results for input(s): AMMONIA in the last 168 hours. CBC: Recent Labs  Lab 12/01/23 0733 12/02/23 0412  WBC 6.9 4.0  NEUTROABS 4.1  --   HGB 14.7 13.0  HCT 44.4 40.7  MCV 102.5* 103.6*  PLT 191 129*   Cardiac Enzymes: No results for input(s): CKTOTAL, CKMB, CKMBINDEX, TROPONINI in the last 168 hours. BNP: Invalid input(s): POCBNP CBG: Recent Labs  Lab 12/01/23 0733  GLUCAP 128*   D-Dimer No results for input(s): DDIMER in the last 72 hours. Hgb A1c No results for input(s): HGBA1C in the last 72 hours. Lipid Profile No results for input(s): CHOL, HDL, LDLCALC, TRIG, CHOLHDL, LDLDIRECT in the last 72 hours. Thyroid function studies No results for input(s): TSH, T4TOTAL, T3FREE, THYROIDAB in the last 72 hours.  Invalid input(s): FREET3 Anemia work up No results for input(s): VITAMINB12, FOLATE, FERRITIN, TIBC, IRON, RETICCTPCT in the last 72 hours. Urinalysis    Component Value Date/Time   COLORURINE YELLOW 12/01/2023 0944   APPEARANCEUR CLEAR 12/01/2023 0944   LABSPEC 1.018 12/01/2023 0944   PHURINE 5.0 12/01/2023 0944   GLUCOSEU NEGATIVE 12/01/2023 0944   HGBUR SMALL (A) 12/01/2023 0944  BILIRUBINUR NEGATIVE  12/01/2023 0944   KETONESUR 5 (A) 12/01/2023 0944   PROTEINUR 100 (A) 12/01/2023 0944   NITRITE NEGATIVE 12/01/2023 0944   LEUKOCYTESUR NEGATIVE 12/01/2023 0944   Sepsis Labs Recent Labs  Lab 12/01/23 0733 12/02/23 0412  WBC 6.9 4.0   Microbiology Recent Results (from the past 240 hours)  Blood Culture (routine x 2)     Status: None (Preliminary result)   Collection Time: 12/01/23  7:55 AM   Specimen: BLOOD  Result Value Ref Range Status   Specimen Description   Final    BLOOD BLOOD RIGHT HAND Performed at [facility], [address] [place], [place] [address]    Special Requests   Final    BOTTLES DRAWN AEROBIC AND ANAEROBIC Blood Culture results may not be optimal due to an inadequate volume of blood received in culture bottles Performed at [facility], [address] [place], [place] [address]    Culture   Final    NO GROWTH 2 DAYS Performed at [facility], [address] [place], [place] [address]    Report Status PENDING  Incomplete  Blood Culture (routine x 2)     Status: None (Preliminary result)   Collection Time: 12/01/23  8:00 AM   Specimen: BLOOD  Result Value Ref Range Status   Specimen Description   Final    BLOOD LEFT ANTECUBITAL Performed at [facility], [address] [place], [place] [address]    Special Requests   Final    BOTTLES DRAWN AEROBIC AND ANAEROBIC Blood Culture results may not be optimal due to an inadequate volume of blood received in culture bottles Performed at [facility], [address], [place], [place] [address]    Culture   Final    NO GROWTH 2 DAYS Performed at [facility], [address], [place], [place] [address]    Report Status PENDING  Incomplete     Time coordinating discharge: Over 30 minutes  SIGNED:   Camellia PARAS [place], DO  [facility] 12/03/2023, 10:56 AM

## 2023-12-03 NOTE — Plan of Care (Signed)
  Problem: Education: Goal: Knowledge of General Education information will improve Description: Including pain rating scale, medication(s)/side effects and non-pharmacologic comfort measures Outcome: Completed/Met   Problem: Health Behavior/Discharge Planning: Goal: Ability to manage health-related needs will improve Outcome: Completed/Met   Problem: Clinical Measurements: Goal: Ability to maintain clinical measurements within normal limits will improve Outcome: Completed/Met Goal: Will remain free from infection Outcome: Completed/Met Goal: Diagnostic test results will improve Outcome: Completed/Met Goal: Cardiovascular complication will be avoided Outcome: Completed/Met   Problem: Activity: Goal: Risk for activity intolerance will decrease Outcome: Completed/Met   Problem: Nutrition: Goal: Adequate nutrition will be maintained Outcome: Completed/Met   Problem: Coping: Goal: Level of anxiety will decrease Outcome: Completed/Met   Problem: Elimination: Goal: Will not experience complications related to bowel motility Outcome: Completed/Met Goal: Will not experience complications related to urinary retention Outcome: Completed/Met   Problem: Pain Managment: Goal: General experience of comfort will improve and/or be controlled Outcome: Completed/Met   Problem: Safety: Goal: Ability to remain free from injury will improve Outcome: Completed/Met   Problem: Skin Integrity: Goal: Risk for impaired skin integrity will decrease Outcome: Completed/Met

## 2023-12-06 LAB — CULTURE, BLOOD (ROUTINE X 2)
Culture: NO GROWTH
Culture: NO GROWTH

## 2023-12-10 ENCOUNTER — Ambulatory Visit: Attending: Emergency Medicine | Admitting: Emergency Medicine

## 2023-12-10 ENCOUNTER — Encounter: Payer: Self-pay | Admitting: Emergency Medicine

## 2023-12-10 VITALS — BP 132/70 | HR 68 | Ht 77.0 in | Wt 232.2 lb

## 2023-12-10 DIAGNOSIS — I872 Venous insufficiency (chronic) (peripheral): Secondary | ICD-10-CM

## 2023-12-10 DIAGNOSIS — I10 Essential (primary) hypertension: Secondary | ICD-10-CM

## 2023-12-10 DIAGNOSIS — F1010 Alcohol abuse, uncomplicated: Secondary | ICD-10-CM | POA: Diagnosis not present

## 2023-12-10 DIAGNOSIS — I350 Nonrheumatic aortic (valve) stenosis: Secondary | ICD-10-CM | POA: Diagnosis not present

## 2023-12-10 DIAGNOSIS — I7121 Aneurysm of the ascending aorta, without rupture: Secondary | ICD-10-CM

## 2023-12-10 DIAGNOSIS — I351 Nonrheumatic aortic (valve) insufficiency: Secondary | ICD-10-CM

## 2023-12-10 DIAGNOSIS — E782 Mixed hyperlipidemia: Secondary | ICD-10-CM

## 2023-12-10 NOTE — Patient Instructions (Signed)
 Medication Instructions:  NO CHANGES  Lab Work: NONE TO BE DONE TODAY.  Testing/Procedures: NONE  Follow-Up: At [facility], you and your health needs are our priority.  As part of our continuing mission to provide you with exceptional heart care, our providers are all part of one team.  This team includes your primary Cardiologist (physician) and Advanced Practice Providers or APPs (Physician Assistants and Nurse Practitioners) who all work together to provide you with the care you need, when you need it.  Your next appointment:   6 MONTHS  Provider:   Newman JINNY Lawrence, MD OR Lum Louis, NP

## 2023-12-10 NOTE — Progress Notes (Signed)
 Cardiology Office Note:    Date:  12/10/2023  ID:  Matthew Cole, DOB April 16, 1960, MRN [id number] PCP: Matthew Lenis, MD  [facility] Providers Cardiologist:  Newman Matthew Lawrence, MD Cardiology APP:  Matthew Lum CROME, NP       Patient Profile:       Chief Complaint: 86-month follow-up History of Present Illness:  Matthew Cole is a 63 y.o. male with visit-pertinent history of hypertension, hyperlipidemia, ascending aortic dilation with family history of rupture, alcohol use, venous insufficiency  He was initially seen by Dr. Lawrence on 11/06/2022 for management of ascending aortic dilation.  He underwent previous 2D echo on 10/08/2022 that showed trace MR with EF of 65% with mild AS and moderately dilated ascending aorta.  He was ordered a CTA of the aorta that was not completed.  He was hospitalized on 12/2022 with alcohol withdrawal and admitted on 12/24/2022 with complaint of dizziness and lightheadedness.  He was discharged after symptoms were found to be vasovagal.  He was seen by Dr. Lawrence on 05/10/2023 was noted to have elevated blood pressure 170/98 and trace lower extremity edema.  He underwent 2D echo that showed LVEF 55 to 60% with no RWMA and mildly dilated LA, moderate thickening of AV with mild aortic stenosis and no dilation present.  CT angio chest aorta 05/27/2023 showed acute atherosclerosis in tortuosity.  Fusiform dilation of ascending aorta measuring up to 4.2 cm.  He was last seen in clinic on 06/07/2023 by Jac[place], NP.  He is doing well with no reported cardiac complaints.  His blood pressures are elevated 170/90 and recheck 164/94.  His atenolol  50 mg daily was discontinued and he was started on carvedilol  3.125 mg twice daily.  He was continued on Benicar 40 mg daily and amlodipine  10 mg daily.  He followed up with cardiothoracic surgery on 07/15/2023 given his family history of aortic rupture.  He was noted to be very tall and has some characteristics of  Marfan syndrome.  Genetic testing was discussed.  He was to have repeat CT chest in 1 year.  He followed up with genetic counseling on 08/30/2023.  He deferred genetic testing for aortopathies at the time.   Discussed the use of AI scribe software for clinical note transcription with the patient, who gave verbal consent to proceed.  History of Present Illness Matthew Cole is a 63 year old male with hypertension and aortic aneurysm who presents for follow-up of his cardiovascular conditions.  Today patient is doing well without cardiovascular concerns.  He reports no symptoms such as chest pain, shortness of breath, dizziness, or syncope.  Denies LEE, orthopnea, PND, palpitations.  He remains active, working part-time and walking regularly.  He continues carvedilol  twice daily for hypertension, with improved home blood pressure readings since August. He also takes amlodipine  and olmesartan. He was recently hospitalized for alcohol withdrawal.  He has entirely stopped drinking alcohol since September 25.   Review of systems:  Please see the history of present illness. All other systems are reviewed and otherwise negative.      Studies Reviewed:        Echocardiogram 05/28/2023 1. The aortic valve is tricuspid. There is moderate calcification of the  aortic valve. There is moderate thickening of the aortic valve. Aortic  valve regurgitation is mild. Mild aortic valve stenosis. Aortic valve  area, by VTI measures 1.63 cm. Aortic  valve mean gradient measures 10.0 mmHg. Aortic valve Vmax measures 2.27  m/s.  2. Left ventricular ejection fraction, by estimation, is 55 to 60%. Left  ventricular ejection fraction by 3D volume is 56 %. The left ventricle has  normal function. The left ventricle has no regional wall motion  abnormalities. Left ventricular diastolic   parameters are consistent with Grade I diastolic dysfunction (impaired  relaxation). The average left ventricular  global longitudinal strain is  -19.5 %. The global longitudinal strain is normal.   3. Right ventricular systolic function is normal. The right ventricular  size is normal.   4. Left atrial size was mildly dilated.   5. The mitral valve is normal in structure. Trivial mitral valve  regurgitation. No evidence of mitral stenosis.   6. The inferior vena cava is normal in size with greater than 50%  respiratory variability, suggesting right atrial pressure of 3 mmHg.    Risk Assessment/Calculations:              Physical Exam:   VS:  BP 132/70   Pulse 68   Ht 6' 5 (1.956 m)   Wt 232 lb 3.2 oz (105.3 kg)   SpO2 96%   BMI 27.53 kg/m    Wt Readings from Last 3 Encounters:  12/10/23 232 lb 3.2 oz (105.3 kg)  12/01/23 227 lb 11.8 oz (103.3 kg)  08/05/23 231 lb (104.8 kg)    GEN: Well nourished, well developed in no acute distress NECK: No JVD; No carotid bruits CARDIAC: RRR.  2/6 systolic murmur noted.  No rubs, gallops RESPIRATORY:  Clear to auscultation without rales, wheezing or rhonchi  ABDOMEN: Soft, non-tender, non-distended EXTREMITIES:  No edema; No acute deformity      Assessment and Plan:  Hypertension Blood pressure today well-controlled at 132/70 Home blood pressure readings since August average 120s over 70s - Continue amlodipine  10 mg daily - Continue carvedilol  3.25 mg twice daily - Continue olmesartan 40 mg daily  Aortic stenosis Aortic regurgitation Echocardiogram 05/28/2023 shows mild aortic valve regurgitation and mild aortic valve stenosis with mean gradient 10 mmHg - Today he is asymptomatic without chest pains, shortness of breath, or syncope - Can repeat echocardiogram in 1-2 years for routine monitoring  Aortic atherosclerosis Hyperlipidemia, LDL goal <70 LDL 145 on 03/2023 and not well-controlled Rosuvastatin  was increased from 5 to 10 mg daily by PCP on 04/2023 Has primarily been managed by PCP and patient prefers to have his lipids reassessed by  his PCP in the next couple weeks - Today he denies any chest pains or exertional symptoms.  No indication for ischemic evaluation at this time - Continue rosuvastatin  10 mg daily  EtOH He has entirely stopped drinking alcohol since December 02, 2023  Ascending thoracic aneurysm CT angio 05/2023 with fusiform dilation of the ascending aorta up to 4.2 cm - Management per cardiothoracic surgery  Venous insufficiency - Management per vascular surgery      Dispo:  Return in about 6 months (around 06/09/2024).  Signed, Lum LITTIE Louis, NP

## 2024-01-10 ENCOUNTER — Encounter: Payer: Self-pay | Admitting: Radiology

## 2024-02-16 ENCOUNTER — Encounter (HOSPITAL_BASED_OUTPATIENT_CLINIC_OR_DEPARTMENT_OTHER): Payer: Self-pay | Admitting: Emergency Medicine

## 2024-02-16 ENCOUNTER — Emergency Department (HOSPITAL_BASED_OUTPATIENT_CLINIC_OR_DEPARTMENT_OTHER)
Admission: EM | Admit: 2024-02-16 | Discharge: 2024-02-16 | Disposition: A | Discharge status: Home / Self Care | Attending: Emergency Medicine | Admitting: Emergency Medicine

## 2024-02-16 ENCOUNTER — Emergency Department (HOSPITAL_BASED_OUTPATIENT_CLINIC_OR_DEPARTMENT_OTHER)

## 2024-02-16 ENCOUNTER — Other Ambulatory Visit: Payer: Self-pay

## 2024-02-16 DIAGNOSIS — W1839XA Other fall on same level, initial encounter: Secondary | ICD-10-CM | POA: Diagnosis not present

## 2024-02-16 DIAGNOSIS — M25511 Pain in right shoulder: Secondary | ICD-10-CM | POA: Diagnosis present

## 2024-02-16 DIAGNOSIS — S4351XA Sprain of right acromioclavicular joint, initial encounter: Secondary | ICD-10-CM | POA: Insufficient documentation

## 2024-02-16 MED ORDER — OXYCODONE-ACETAMINOPHEN 5-325 MG PO TABS
1.0000 | ORAL_TABLET | Freq: Once | ORAL | Status: AC
  Administered 2024-02-16: 1 via ORAL
  Filled 2024-02-16: qty 1

## 2024-02-16 NOTE — Discharge Instructions (Signed)
 Recommend ice, Tylenol  1000 mg every 6 hours as needed for pain and use sling for comfort.  Nonweightbearing to right upper extremity.  Follow-up with orthopedics Dr. Beuford.  Overall I do think you sprained your [facility] joint and your right shoulder.  Return if symptoms worsen.

## 2024-02-16 NOTE — ED Provider Notes (Signed)
 [facility] Provider Note   CSN: [id number] Arrival date & time: 02/16/24  1803     Patient presents with: Matthew Cole is a 63 y.o. male.   Patient here with right shoulder pain after he fell.  He was carrying a mattress lost his handle fell backwards and landed on his right shoulder.  Did not hit his head or lose consciousness.  He is having pain focally in the right shoulder.  Not having any neck pain.  No weakness numbness tingling.  He is ambulatory after the fall.  He is not on any blood thinners.  No prior injury to the shoulder in the past.  The history is provided by the patient.       Prior to Admission medications   Medication Sig Start Date End Date Taking? Authorizing Provider  amLODipine  (NORVASC ) 10 MG tablet Take 10 mg by mouth daily.    [provider]  carvedilol  (COREG ) 3.125 MG tablet TAKE 1 TABLET BY MOUTH 2 TIMES DAILY. 07/13/23   Wyn Jackee VEAR Mickey., NP  folic acid  (FOLVITE ) 1 MG tablet Take 1 tablet (1 mg total) by mouth daily. 12/22/22   Briana Elgin LABOR, MD  gabapentin  (NEURONTIN ) 300 MG capsule Take 2 capsules (600 mg total) by mouth 2 (two) times daily. 12/03/23 01/02/24  [place], Eric J, DO  Multiple Vitamin (MULTIVITAMIN WITH MINERALS) TABS tablet Take 1 tablet by mouth daily.    [provider]  olmesartan (BENICAR) 40 MG tablet Take 40 mg by mouth daily. 07/05/20   [provider]  rosuvastatin  (CRESTOR ) 10 MG tablet Take 10 mg by mouth daily.    [provider]  thiamine  (VITAMIN B-1) 100 MG tablet Take 1 tablet (100 mg total) by mouth daily. 12/22/22   Briana Elgin LABOR, MD  traZODone  (DESYREL ) 150 MG tablet Take 150 mg by mouth at bedtime.    [provider]    Allergies: Ciprofloxacin    Review of Systems  Updated Vital Signs BP (!) 161/90 (BP Location: Left Arm)   Pulse 75   Temp 97.7 F (36.5 C)   Resp 20   Ht 6' 5 (1.956 m)   Wt 106.1 kg    SpO2 98%   BMI 27.75 kg/m   Physical Exam Vitals and nursing note reviewed.  Constitutional:      General: He is not in acute distress.    Appearance: He is well-developed.  HENT:     Head: Normocephalic and atraumatic.  Eyes:     Extraocular Movements: Extraocular movements intact.     Conjunctiva/sclera: Conjunctivae normal.     Pupils: Pupils are equal, round, and reactive to light.  Cardiovascular:     Rate and Rhythm: Normal rate and regular rhythm.     Heart sounds: No murmur heard. Pulmonary:     Effort: Pulmonary effort is normal. No respiratory distress.     Breath sounds: Normal breath sounds.  Abdominal:     Palpations: Abdomen is soft.     Tenderness: There is no abdominal tenderness.  Musculoskeletal:        General: Tenderness present. No swelling.     Cervical back: Normal range of motion and neck supple. No tenderness.     Comments: Tenderness in the right AC joint area, no midline spinal tenderness  Skin:    General: Skin is warm and dry.     Capillary Refill: Capillary refill takes less than 2  seconds.  Neurological:     General: No focal deficit present.     Mental Status: He is alert and oriented to person, place, and time.     Cranial Nerves: No cranial nerve deficit.     Sensory: No sensory deficit.     Motor: No weakness.     Coordination: Coordination normal.     Comments: Normal strength normal sensation  Psychiatric:        Mood and Affect: Mood normal.     (all labs ordered are listed, but only abnormal results are displayed) Labs Reviewed - No data to display  EKG: None  Radiology: DG Shoulder Right Result Date: 02/16/2024 CLINICAL DATA:  Fall right shoulder pain EXAM: RIGHT SHOULDER - 2+ VIEW COMPARISON:  None Available. FINDINGS: Widened AC joint without displacement. No fracture. No fracture or malalignment at the glenohumeral interval. Right apex is clear IMPRESSION: Widened AC joint without displacement, correlate for [facility] joint  injury. Electronically Signed   By: Luke Bun M.D.   On: 02/16/2024 18:39     Procedures   Medications Ordered in the ED  oxyCODONE -acetaminophen  (PERCOCET/ROXICET) 5-325 MG per tablet 1 tablet (has no administration in time range)                                    Medical Decision Making Amount and/or Complexity of Data Reviewed Radiology: ordered.  Risk Prescription drug management.   [name] is here with right shoulder pain after fall.  He lost handle of a mattress he was moving and landed on his right shoulder.  He is tender in the right shoulder.  He is neurovascular neuromuscular intact otherwise.  He is not on any blood thinners.  Did not hit his head or lose consciousness.  He is ambulatory after the fall does not have any pain elsewhere.  X-ray of the right shoulder per radiology report shows widened AC joint without major displacement.  I do think he has clinically an AC joint injury.  Will put him in a splint give him a dose of Percocet here and recommend Tylenol  ibuprofen  rest ice and sling for comfort.  I would not bear any weight to the right upper extremity until he sees an orthopedic doctor.  He understands return precautions.  Will refer him to orthopedics.  Discharge.  I have no concern for any other traumatic process.  This chart was dictated using voice recognition software.  Despite best efforts to proofread,  errors can occur which can change the documentation meaning.      Final diagnoses:  Sprain of right acromioclavicular ligament, initial encounter    ED Discharge Orders     None          Ruthe Cornet, DO 02/16/24 1902

## 2024-02-16 NOTE — ED Triage Notes (Signed)
 Pt fell backward from 1 step onto back (on hardwood floor) while moving a mattress; c/o RT shoulder pain with decreased ROM
